# Patient Record
Sex: Female | Born: 1935 | Race: Black or African American | Hispanic: No | Smoking: Never smoker
Health system: Southern US, Community
[De-identification: ages and names within clinical notes are randomized; demographics above are authoritative.]

## PROBLEM LIST (undated history)

## (undated) DIAGNOSIS — M1990 Unspecified osteoarthritis, unspecified site: Secondary | ICD-10-CM

## (undated) DIAGNOSIS — E785 Hyperlipidemia, unspecified: Secondary | ICD-10-CM

## (undated) DIAGNOSIS — T7840XA Allergy, unspecified, initial encounter: Secondary | ICD-10-CM

## (undated) HISTORY — PX: OTHER SURGICAL HISTORY: SHX169

## (undated) HISTORY — PX: APPENDECTOMY: SHX54

## (undated) HISTORY — PX: EXPLORATORY LAPAROTOMY: SUR591

## (undated) HISTORY — PX: TONSILLECTOMY: SUR1361

## (undated) HISTORY — DX: Hyperlipidemia, unspecified: E78.5

## (undated) HISTORY — DX: Unspecified osteoarthritis, unspecified site: M19.90

## (undated) HISTORY — DX: Allergy, unspecified, initial encounter: T78.40XA

---

## 1997-06-29 ENCOUNTER — Other Ambulatory Visit: Admission: RE | Admit: 1997-06-29 | Discharge: 1997-06-29 | Payer: Self-pay | Admitting: Internal Medicine

## 1997-12-21 ENCOUNTER — Ambulatory Visit (HOSPITAL_COMMUNITY): Admission: RE | Admit: 1997-12-21 | Discharge: 1997-12-21 | Payer: Self-pay | Admitting: Internal Medicine

## 1998-12-29 ENCOUNTER — Ambulatory Visit (HOSPITAL_COMMUNITY): Admission: RE | Admit: 1998-12-29 | Discharge: 1998-12-29 | Payer: Self-pay | Admitting: Internal Medicine

## 1998-12-29 ENCOUNTER — Encounter: Payer: Self-pay | Admitting: Internal Medicine

## 1999-02-24 ENCOUNTER — Encounter: Admission: RE | Admit: 1999-02-24 | Discharge: 1999-02-24 | Payer: Self-pay | Admitting: Internal Medicine

## 1999-02-24 ENCOUNTER — Encounter: Payer: Self-pay | Admitting: Internal Medicine

## 1999-07-07 ENCOUNTER — Encounter: Admission: RE | Admit: 1999-07-07 | Discharge: 1999-07-07 | Payer: Self-pay | Admitting: Internal Medicine

## 1999-07-07 ENCOUNTER — Encounter: Payer: Self-pay | Admitting: Internal Medicine

## 2000-01-02 ENCOUNTER — Ambulatory Visit (HOSPITAL_COMMUNITY): Admission: RE | Admit: 2000-01-02 | Discharge: 2000-01-02 | Payer: Self-pay | Admitting: Internal Medicine

## 2000-01-02 ENCOUNTER — Encounter: Payer: Self-pay | Admitting: Internal Medicine

## 2000-12-05 ENCOUNTER — Other Ambulatory Visit: Admission: RE | Admit: 2000-12-05 | Discharge: 2000-12-05 | Payer: Self-pay | Admitting: Internal Medicine

## 2001-02-26 ENCOUNTER — Ambulatory Visit (HOSPITAL_COMMUNITY): Admission: RE | Admit: 2001-02-26 | Discharge: 2001-02-26 | Payer: Self-pay | Admitting: Internal Medicine

## 2001-02-26 ENCOUNTER — Encounter: Payer: Self-pay | Admitting: Internal Medicine

## 2002-02-28 ENCOUNTER — Ambulatory Visit (HOSPITAL_COMMUNITY): Admission: RE | Admit: 2002-02-28 | Discharge: 2002-02-28 | Payer: Self-pay | Admitting: Internal Medicine

## 2002-02-28 ENCOUNTER — Encounter: Payer: Self-pay | Admitting: Internal Medicine

## 2003-03-03 ENCOUNTER — Ambulatory Visit (HOSPITAL_COMMUNITY): Admission: RE | Admit: 2003-03-03 | Discharge: 2003-03-03 | Payer: Self-pay | Admitting: Internal Medicine

## 2003-08-10 ENCOUNTER — Ambulatory Visit (HOSPITAL_COMMUNITY): Admission: RE | Admit: 2003-08-10 | Discharge: 2003-08-10 | Payer: Self-pay | Admitting: Specialist

## 2003-12-25 ENCOUNTER — Other Ambulatory Visit: Admission: RE | Admit: 2003-12-25 | Discharge: 2003-12-25 | Payer: Self-pay | Admitting: Internal Medicine

## 2004-02-09 ENCOUNTER — Ambulatory Visit: Payer: Self-pay | Admitting: Internal Medicine

## 2004-02-24 ENCOUNTER — Ambulatory Visit: Payer: Self-pay | Admitting: Internal Medicine

## 2004-05-23 ENCOUNTER — Ambulatory Visit: Payer: Self-pay | Admitting: Internal Medicine

## 2004-08-02 ENCOUNTER — Encounter: Admission: RE | Admit: 2004-08-02 | Discharge: 2004-08-02 | Payer: Self-pay | Admitting: Internal Medicine

## 2004-08-24 ENCOUNTER — Ambulatory Visit: Payer: Self-pay | Admitting: Family Medicine

## 2004-09-29 ENCOUNTER — Ambulatory Visit: Payer: Self-pay | Admitting: Internal Medicine

## 2004-10-06 ENCOUNTER — Ambulatory Visit: Payer: Self-pay | Admitting: Internal Medicine

## 2005-02-06 ENCOUNTER — Ambulatory Visit: Payer: Self-pay | Admitting: Internal Medicine

## 2005-06-05 ENCOUNTER — Ambulatory Visit: Payer: Self-pay | Admitting: Internal Medicine

## 2005-12-18 ENCOUNTER — Ambulatory Visit: Payer: Self-pay | Admitting: Internal Medicine

## 2005-12-25 ENCOUNTER — Ambulatory Visit: Payer: Self-pay | Admitting: Internal Medicine

## 2006-01-11 ENCOUNTER — Ambulatory Visit: Payer: Self-pay | Admitting: Gastroenterology

## 2006-02-21 ENCOUNTER — Encounter (INDEPENDENT_AMBULATORY_CARE_PROVIDER_SITE_OTHER): Payer: Self-pay | Admitting: *Deleted

## 2006-02-21 ENCOUNTER — Ambulatory Visit: Payer: Self-pay | Admitting: Gastroenterology

## 2006-03-02 ENCOUNTER — Ambulatory Visit: Payer: Self-pay | Admitting: Internal Medicine

## 2006-07-04 ENCOUNTER — Ambulatory Visit: Payer: Self-pay | Admitting: Internal Medicine

## 2006-07-04 LAB — CONVERTED CEMR LAB
Albumin: 3.6 g/dL (ref 3.5–5.2)
Alkaline Phosphatase: 61 units/L (ref 39–117)
LDL Cholesterol: 136 mg/dL — ABNORMAL HIGH (ref 0–99)
Total CHOL/HDL Ratio: 5.1

## 2006-08-27 ENCOUNTER — Ambulatory Visit: Payer: Self-pay | Admitting: Internal Medicine

## 2006-08-27 ENCOUNTER — Encounter: Payer: Self-pay | Admitting: Internal Medicine

## 2006-11-12 DIAGNOSIS — J309 Allergic rhinitis, unspecified: Secondary | ICD-10-CM | POA: Insufficient documentation

## 2006-11-12 DIAGNOSIS — E785 Hyperlipidemia, unspecified: Secondary | ICD-10-CM

## 2007-01-07 ENCOUNTER — Ambulatory Visit: Payer: Self-pay | Admitting: Internal Medicine

## 2007-01-07 DIAGNOSIS — T887XXA Unspecified adverse effect of drug or medicament, initial encounter: Secondary | ICD-10-CM

## 2007-01-07 DIAGNOSIS — M1990 Unspecified osteoarthritis, unspecified site: Secondary | ICD-10-CM

## 2007-01-07 LAB — CONVERTED CEMR LAB
AST: 28 units/L (ref 0–37)
Alkaline Phosphatase: 61 units/L (ref 39–117)
Cholesterol, target level: 200 mg/dL
HDL goal, serum: 40 mg/dL
HDL: 39.2 mg/dL (ref >39.0)
LDL Goal: 130 mg/dL
Total Bilirubin: 0.7 mg/dL (ref 0.3–1.2)
Total Protein: 6.7 g/dL (ref 6.0–8.3)
Triglycerides: 127 mg/dL (ref 0–149)

## 2007-05-22 ENCOUNTER — Ambulatory Visit: Payer: Self-pay | Admitting: Internal Medicine

## 2007-05-22 LAB — CONVERTED CEMR LAB
BUN: 14 mg/dL (ref 6–23)
Basophils Absolute: 0 10*3/uL (ref 0.0–0.1)
Calcium: 9.1 mg/dL (ref 8.4–10.5)
Chloride: 106 meq/L (ref 96–112)
Eosinophils Absolute: 0.1 10*3/uL (ref 0.0–0.6)
Eosinophils Relative: 1.2 % (ref 0.0–5.0)
GFR calc Af Amer: 68 mL/min
GFR calc non Af Amer: 56 mL/min
Glucose, Bld: 94 mg/dL (ref 70–99)
MCV: 95 fL (ref 78.0–100.0)
Monocytes Relative: 9.2 % (ref 3.0–11.0)
Neutro Abs: 4.2 10*3/uL (ref 1.4–7.7)
Platelets: 189 10*3/uL (ref 150–400)
RBC: 4.27 M/uL (ref 3.87–5.11)
WBC: 7.2 10*3/uL (ref 4.5–10.5)

## 2007-09-23 ENCOUNTER — Ambulatory Visit: Payer: Self-pay | Admitting: Internal Medicine

## 2007-09-23 DIAGNOSIS — M1710 Unilateral primary osteoarthritis, unspecified knee: Secondary | ICD-10-CM | POA: Insufficient documentation

## 2007-09-23 LAB — CONVERTED CEMR LAB
ALT: 21 units/L (ref 0–35)
AST: 23 units/L (ref 0–37)
Cholesterol: 190 mg/dL (ref 0–200)
HDL: 38.1 mg/dL — ABNORMAL LOW (ref >39.0)
Triglycerides: 96 mg/dL (ref 0–149)
VLDL: 19 mg/dL (ref 0–40)

## 2007-09-25 ENCOUNTER — Telehealth (INDEPENDENT_AMBULATORY_CARE_PROVIDER_SITE_OTHER): Payer: Self-pay | Admitting: *Deleted

## 2007-09-26 ENCOUNTER — Ambulatory Visit: Payer: Self-pay

## 2007-09-26 ENCOUNTER — Encounter: Payer: Self-pay | Admitting: Internal Medicine

## 2008-01-08 ENCOUNTER — Ambulatory Visit: Payer: Self-pay | Admitting: Internal Medicine

## 2008-01-08 LAB — CONVERTED CEMR LAB
ALT: 20 units/L (ref 0–35)
HDL: 39.6 mg/dL (ref >39.0)
Total Bilirubin: 0.6 mg/dL (ref 0.3–1.2)
Total CHOL/HDL Ratio: 5
VLDL: 21 mg/dL (ref 0–40)

## 2008-05-25 ENCOUNTER — Ambulatory Visit: Payer: Self-pay | Admitting: Internal Medicine

## 2008-05-25 DIAGNOSIS — M810 Age-related osteoporosis without current pathological fracture: Secondary | ICD-10-CM | POA: Insufficient documentation

## 2008-06-03 LAB — CONVERTED CEMR LAB: Vit D, 25-Hydroxy: 22 ng/mL — ABNORMAL LOW (ref 30–89)

## 2008-07-18 DEATH — deceased

## 2008-08-28 ENCOUNTER — Encounter: Payer: Self-pay | Admitting: Internal Medicine

## 2008-08-28 ENCOUNTER — Ambulatory Visit: Payer: Self-pay | Admitting: Family Medicine

## 2008-09-25 ENCOUNTER — Ambulatory Visit: Payer: Self-pay | Admitting: Internal Medicine

## 2008-09-25 LAB — CONVERTED CEMR LAB
Albumin: 3.8 g/dL (ref 3.5–5.2)
Alkaline Phosphatase: 53 units/L (ref 39–117)
Cholesterol: 205 mg/dL — ABNORMAL HIGH (ref 0–200)
HDL: 41.2 mg/dL (ref >39.00)
Total Protein: 7 g/dL (ref 6.0–8.3)
VLDL: 26 mg/dL (ref 0.0–40.0)

## 2009-02-03 ENCOUNTER — Ambulatory Visit: Payer: Self-pay | Admitting: Internal Medicine

## 2009-03-29 ENCOUNTER — Ambulatory Visit: Payer: Self-pay | Admitting: Internal Medicine

## 2009-03-29 LAB — CONVERTED CEMR LAB
ALT: 22 units/L (ref 0–35)
AST: 23 units/L (ref 0–37)
Albumin: 3.6 g/dL (ref 3.5–5.2)
Alkaline Phosphatase: 52 units/L (ref 39–117)
Basophils Relative: 0.8 % (ref 0.0–3.0)
Cholesterol, target level: 200 mg/dL
Cholesterol: 193 mg/dL (ref 0–200)
Direct LDL: 128 mg/dL
Eosinophils Relative: 1.5 % (ref 0.0–5.0)
Hemoglobin: 13.1 g/dL (ref 12.0–15.0)
Lymphocytes Relative: 27.8 % (ref 12.0–46.0)
MCHC: 32.9 g/dL (ref 30.0–36.0)
Monocytes Relative: 8.9 % (ref 3.0–12.0)
Neutro Abs: 3.4 10*3/uL (ref 1.4–7.7)
RBC: 4.11 M/uL (ref 3.87–5.11)
Total Protein: 6.6 g/dL (ref 6.0–8.3)
Vit D, 25-Hydroxy: 47 ng/mL (ref 30–89)

## 2009-04-28 ENCOUNTER — Telehealth: Payer: Self-pay | Admitting: Internal Medicine

## 2009-07-26 ENCOUNTER — Ambulatory Visit: Payer: Self-pay | Admitting: Internal Medicine

## 2009-07-26 LAB — CONVERTED CEMR LAB
Cholesterol: 204 mg/dL — ABNORMAL HIGH (ref 0–200)
Total CHOL/HDL Ratio: 4
Vit D, 25-Hydroxy: 47 ng/mL (ref 30–89)

## 2009-12-03 ENCOUNTER — Ambulatory Visit: Payer: Self-pay | Admitting: Internal Medicine

## 2009-12-03 ENCOUNTER — Telehealth: Payer: Self-pay | Admitting: Internal Medicine

## 2009-12-03 DIAGNOSIS — R05 Cough: Secondary | ICD-10-CM

## 2009-12-03 DIAGNOSIS — R059 Cough, unspecified: Secondary | ICD-10-CM | POA: Insufficient documentation

## 2009-12-06 ENCOUNTER — Ambulatory Visit: Payer: Self-pay | Admitting: Internal Medicine

## 2009-12-06 DIAGNOSIS — J189 Pneumonia, unspecified organism: Secondary | ICD-10-CM | POA: Insufficient documentation

## 2010-01-24 ENCOUNTER — Ambulatory Visit: Payer: Self-pay | Admitting: Internal Medicine

## 2010-01-24 LAB — CONVERTED CEMR LAB: Vit D, 25-Hydroxy: 69 ng/mL (ref 30–89)

## 2010-04-21 NOTE — Assessment & Plan Note (Signed)
Summary: 6 month rov/njr   Vital Signs:  Patient profile:   77 year old female Height:      64 inches Weight:      164 pounds BMI:     28.25 Temp:     98.2 degrees F oral Pulse rate:   72 / minute Resp:     14 per minute BP sitting:   122 / 80  (left arm)  Vitals Entered By: Willy Eddy, LPN (January 24, 2010 8:28 AM) CC: roa- uri much improved, Lipid Management Is Patient Diabetic? No   Primary Care Provider:  Stacie Glaze MD  CC:  roa- uri much improved and Lipid Management.  History of Present Illness: the last visit was for pneumonia It  has completely resolved she has been on ergocalciferaol for vit d deficiency and needs testing she ahs OA and has been on mobic with stabe results not walking!!!!!! discussion of 1000 steps needed to prevent falls   Lipid Management History:      Positive NCEP/ATP III risk factors include female age 38 years old or older.  Negative NCEP/ATP III risk factors include no history of early menopause without estrogen hormone replacement, non-diabetic, no family history for ischemic heart disease, non-tobacco-user status, non-hypertensive, no ASHD (atherosclerotic heart disease), no prior stroke/TIA, no peripheral vascular disease, and no history of aortic aneurysm.     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) 4)  Osteoporosis  (ICD-733.00) 5)  Loc Osteoarthros Not Spec Prim/sec Lower Leg  (ICD-715.36) 6)  Advef, Drug/medicinal/biological Subst Nos  (ICD-995.20) 7)  Osteoarthritis  (ICD-715.90) 8)  Family History Diabetes 1st Degree Relative  (ICD-V18.0) 9)  Hyperlipidemia  (ICD-272.4) 10)  Allergic Rhinitis  (ICD-477.9)  Current Problems (verified): 1)  Pneumonia, Right Upper Lobe   (ICD-486) 2)  Cough  (ICD-786.2) 3)  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 One By Mouth Daily 2)  Mobic 15 Mg  Tabs (Meloxicam) .... Take 1 Tablet By Mouth Once A Day 3)  Fish Oil 1000 Mg  Caps (Omega-3 Fatty Acids) .Marland Kitchen.. 1 Once Daily 4)  Ergocalciferol 50000 Unit Caps (Ergocalciferol) .Marland Kitchen.. 1 Every Week 5)  Calcium Carbonate-Vitamin D 600-400 Mg-Unit Tabs (Calcium Carbonate-Vitamin D) .... One By Mouth  Bid 6)  Avelox 400 Mg Tabs (Moxifloxacin Hcl) .... One By Mouth Dialy For 5 Day 7)  Hydromet 5-1.5 Mg/56ml Syrp (Hydrocodone-Homatropine) .... One Teaspoon Every 8 Hours As Needed Cough  Current Medications (verified): 1)  Zocor 40 Mg  Tabs (Simvastatin) .... One By Mouth Daily 2)  Mobic 15 Mg  Tabs (Meloxicam) .... Take 1 Tablet By Mouth Once A Day 3)  Fish Oil 1000 Mg  Caps (Omega-3 Fatty Acids) .Marland Kitchen.. 1 Once Daily 4)  Ergocalciferol 50000 Unit Caps (Ergocalciferol) .Marland Kitchen.. 1 Every Week 5)  Calcium Carbonate-Vitamin D 600-400 Mg-Unit Tabs (Calcium Carbonate-Vitamin D) .... One By Mouth  Bid  Allergies (verified): No Known Drug Allergies  Past History:  Family History: Last updated: 11/12/2006 Fam hx MI Family History Diabetes 1st degree relative Family History Hypertension Family History of Cardiovascular disorder  Social History: Last updated: 01/07/2007 Retired Married Never Smoked Alcohol use-no Drug use-no  Risk Factors: Smoking Status: never (01/24/2010)  Past medical, surgical, family and social histories (including risk factors) reviewed, and no changes noted (except as noted  below).  Past Medical History: Reviewed history from 01/07/2007 and no changes required. Allergic rhinitis Hyperlipidemia Osteoarthritis  Past Surgical History: Reviewed history from 11/12/2006 and no changes required. Appendectomy Laparotomy-exploratory Tonsillectomy Foot sx-bilat  Family History: Reviewed history from 11/12/2006 and no changes required. Fam hx MI Family History Diabetes 1st degree relative Family History Hypertension Family History of Cardiovascular disorder  Social History: Reviewed history from 01/07/2007 and no changes required. Retired Married Never Smoked Alcohol use-no Drug use-no  Review of Systems  The patient denies anorexia, fever, weight loss, weight gain, vision loss, decreased hearing, hoarseness, chest pain, syncope, dyspnea on exertion, peripheral edema, prolonged cough, headaches, hemoptysis, abdominal pain, melena, hematochezia, severe indigestion/heartburn, hematuria, incontinence, genital sores, muscle weakness, suspicious skin lesions, transient blindness, difficulty walking, depression, unusual weight change, abnormal bleeding, enlarged lymph nodes, angioedema, and breast masses.         Flu Vaccine Consent Questions     Do you have a history of severe allergic reactions to this vaccine? no    Any prior history of allergic reactions to egg and/or gelatin? no    Do you have a sensitivity to the preservative Thimersol? no    Do you have a past history of Guillan-Barre Syndrome? no    Do you currently have an acute febrile illness? no    Have you ever had a severe reaction to latex? no    Vaccine information given and explained to patient? yes    Are you currently pregnant? no    Lot Number:[id number]   Exp Date:09/17/2010   Site Given  Left Deltoid IM   Physical Exam  General:  average weight, good nutrition, and meticulous grooming.   Head:  Normocephalic and atraumatic without obvious abnormalities. No apparent alopecia or  balding. Ears:  R ear normal and L ear normal.   Nose:  no external deformity and no nasal discharge.   Mouth:  Oral mucosa and oropharynx without lesions or exudates.  Teeth in good repair. Neck:  supple.   Lungs:  normal respiratory effort, no crackles, and no wheezes.   Heart:  normal rate and regular rhythm.   Abdomen:  soft, non-tender, and normal bowel sounds.   Msk:  decreased ROM, joint tenderness, and joint swelling.   Pulses:  R and L carotid,radial,femoral,dorsalis pedis and posterior tibial pulses are full and equal bilaterally Extremities:  trace left pedal edema and trace right pedal edema.   Neurologic:  alert & oriented X3 and finger-to-nose normal.     Impression & Recommendations:  Problem # 1:  PNEUMONIA, RIGHT UPPER LOBE (ICD-486)  resolved The following medications were removed from the medication list:    Avelox 400 Mg Tabs (Moxifloxacin hcl) ..... One by mouth dialy for 5 day  nstructed patient to complete antibiotics, and call for worsened shortness of breath or new symptoms.   Problem # 2:  OSTEOPOROSIS (ICD-733.00) blood work to [facility] the appropriate dose vitamin d 50,000 a week Discussed medication use, applications of heat or ice, and exercises.   Orders: T-Vitamin D (25-Hydroxy) [phone]) Venipuncture (  13244)  Problem # 3:  OSTEOARTHRITIS (ICD-715.90) stable Her updated medication list for this problem includes:    Mobic 15 Mg Tabs (Meloxicam) .Marland Kitchen... Take 1 tablet by mouth once a day  Complete Medication List: 1)  Zocor 40 Mg Tabs (Simvastatin) .... One by mouth daily 2)  Mobic 15 Mg Tabs (Meloxicam) .... Take 1 tablet by mouth once a day 3)  Fish Oil 1000 Mg Caps (Omega-3 fatty acids) .Marland Kitchen.. 1 once daily 4)  Ergocalciferol 50000 Unit Caps (Ergocalciferol) .Marland Kitchen.. 1 every week 5)  Calcium Carbonate-vitamin D 600-400 Mg-unit Tabs (Calcium carbonate-vitamin d) .... One by mouth  bid  Other Orders: Flu Vaccine 20yrs + MEDICARE PATIENTS  (W1027) Administration Flu vaccine - MCR (G0008)  Lipid Assessment/Plan:      Based on NCEP/ATP III, the patient's risk factor category is "0-1 risk factors".  The patient's lipid goals are as follows: Total cholesterol goal is 200; LDL cholesterol goal is 160; HDL cholesterol goal is 40; Triglyceride goal is 150.  Her LDL cholesterol goal has been met.    Patient Instructions: 1)  Please schedule a follow-up appointment in 4 months.   Orders Added: 1)  Flu Vaccine 41yrs + MEDICARE PATIENTS [Q2039] 2)  Administration Flu vaccine - MCR [G0008] 3)  T-Vitamin D (25-Hydroxy) [25366-44034] 4)  Venipuncture [74259] 5)  Est. Patient Level IV [56387]  Appended Document: Orders Update    Clinical Lists Changes  Orders: Added new Service order of Specimen Handling (56433) - Signed

## 2010-04-21 NOTE — Assessment & Plan Note (Signed)
Summary: rov/mm   Vital Signs:  Patient profile:   77 year old female Height:      64 inches Weight:      168 pounds Temp:     98.2 degrees F oral Pulse rate:   68 / minute Resp:     14 per minute BP sitting:   116 / 64  (left arm)  Vitals Entered By: Willy Eddy, LPN (March 29, 2009 8:13 AM) CC: roa, Lipid Management   CC:  roa and Lipid Management.  History of Present Illness: follow up moderated arthritic pain tolerating lipid meds allergies are stable  Lipid Management History:      Positive NCEP/ATP III risk factors include female age 14 years old or older.  Negative NCEP/ATP III risk factors include no history of early menopause without estrogen hormone replacement, non-diabetic, no family history for ischemic heart disease, non-tobacco-user status, non-hypertensive, no ASHD (atherosclerotic heart disease), no prior stroke/TIA, no peripheral vascular disease, and no history of aortic aneurysm.     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 One By Mouth Daily 2)  Mobic 15 Mg  Tabs (Meloxicam) .... Take 1 Tablet By Mouth Once A Day 3)  Fish Oil 1000 Mg  Caps (Omega-3 Fatty Acids) .Marland Kitchen.. 1 Once Daily 4)  Ergocalciferol 50000 Unit Caps (Ergocalciferol) .Marland Kitchen.. 1 Every Week 5)  Calcium Carbonate-Vitamin D 600-400 Mg-Unit Tabs (Calcium Carbonate-Vitamin D) .... One By Mouth  Bid  Current Medications (verified): 1)  Zocor 40 Mg  Tabs (Simvastatin) .... One By Mouth  Daily 2)  Mobic 15 Mg  Tabs (Meloxicam) .... Take 1 Tablet By Mouth Once A Day 3)  Fish Oil 1000 Mg  Caps (Omega-3 Fatty Acids) .Marland Kitchen.. 1 Once Daily 4)  Ergocalciferol 50000 Unit Caps (Ergocalciferol) .Marland Kitchen.. 1 Every Week 5)  Calcium Carbonate-Vitamin D 600-400 Mg-Unit Tabs (Calcium Carbonate-Vitamin D) .... One By Mouth  Bid  Allergies (verified): No Known Drug Allergies  Past History:  Family History: Last updated: 11/12/2006 Fam hx MI Family History Diabetes 1st degree relative Family History Hypertension Family History of Cardiovascular disorder  Social History: Last updated: 01/07/2007 Retired Married Never Smoked Alcohol use-no Drug use-no  Risk Factors: Smoking Status: never (03/29/2009)  Past medical, surgical, family and social histories (including risk factors) reviewed, and no changes noted (except as noted below).  Past Medical History: Reviewed history from 01/07/2007 and no changes required. Allergic rhinitis Hyperlipidemia Osteoarthritis  Past Surgical History: Reviewed history from 11/12/2006 and no changes required. Appendectomy Laparotomy-exploratory Tonsillectomy Foot sx-bilat  Family History: Reviewed history from 11/12/2006 and no changes required. Fam hx MI Family History Diabetes 1st degree relative Family History Hypertension Family History of Cardiovascular disorder  Social History: Reviewed history from 01/07/2007 and no changes required. Retired Married Never Smoked Alcohol use-no Drug use-no  Review of Systems  The patient denies anorexia, fever, weight loss, weight gain, vision loss, decreased hearing, hoarseness, chest pain, syncope, dyspnea on exertion, peripheral edema, prolonged cough, headaches, hemoptysis, abdominal pain, melena, hematochezia, severe indigestion/heartburn,  hematuria, incontinence, genital sores, muscle weakness, suspicious skin lesions, transient blindness, difficulty walking, depression, unusual weight change,  abnormal bleeding, enlarged lymph nodes, angioedema, and breast masses.    Physical Exam  General:  average weight, good nutrition, and meticulous grooming.   Head:  Normocephalic and atraumatic without obvious abnormalities. No apparent alopecia or balding. Ears:  R ear normal and L ear normal.   Nose:  no external deformity and no nasal discharge.   Neck:  supple.   Lungs:  normal respiratory effort and normal breath sounds.   Heart:  normal rate and regular rhythm.   Abdomen:  soft, non-tender, and normal bowel sounds.   Msk:  decreased ROM, joint tenderness, and joint swelling.   Extremities:  trace left pedal edema and trace right pedal edema.   Neurologic:  alert & oriented X3 and finger-to-nose normal.     Impression & Recommendations:  Problem # 1:  HYPERLIPIDEMIA (ICD-272.4) Assessment Improved review of zocor and the potential sie effect . she feels well.  Her updated medication list for this problem includes:    Zocor 40 Mg Tabs (Simvastatin) ..... One by mouth daily  Labs Reviewed: SGOT: 26 (09/25/2008)   SGPT: 16 (09/25/2008)  Lipid Goals: Chol Goal: 200 (01/07/2007)   HDL Goal: 40 (01/07/2007)   LDL Goal: 130 (01/07/2007)   TG Goal: 150 (01/07/2007)  Prior 10 Yr Risk Heart Disease: 11 % (09/25/2008)   HDL:41.20 (09/25/2008), 39.6 (01/08/2008)  LDL:138 (01/08/2008), 133 (09/23/2007)  Chol:205 (09/25/2008), 198 (01/08/2008)  Trig:130.0 (09/25/2008), 103 (01/08/2008)  Orders: Venipuncture (91478) TLB-Cholesterol, HDL (83718-HDL) TLB-Cholesterol, Direct LDL (83721-DIRLDL) TLB-Cholesterol, Total (82465-CHO) TLB-TSH (Thyroid Stimulating Hormone) (84443-TSH)  Problem # 2:  OSTEOPOROSIS (ICD-733.00) Assessment: Unchanged  on vitamin d and calcium Discussed medication use, applications of heat or ice, and exercises.   Orders: T-Vitamin D (25-Hydroxy) [phone])  Problem # 3:  ALLERGIC RHINITIS (ICD-477.9) stable Discussed use of allergy medications and  environmental measures.   Problem # 4:  OSTEOARTHRITIS (ICD-715.90) monitering the arthrotis and discussion of exercizes Orders: TLB-Hepatic/Liver Function Pnl (80076-HEPATIC)  Her updated medication list for this problem includes:    Mobic 15 Mg Tabs (Meloxicam) .Marland Kitchen... Take 1 tablet by mouth once a day  Discussed use of medications, application of heat or cold, and exercises.   Complete Medication List: 1)  Zocor 40 Mg Tabs (Simvastatin) .... One by mouth daily 2)  Mobic 15 Mg Tabs (Meloxicam) .... Take 1 tablet by mouth once a day 3)  Fish Oil 1000 Mg Caps (Omega-3 fatty acids) .Marland Kitchen.. 1 once daily 4)  Ergocalciferol 50000 Unit Caps (Ergocalciferol) .Marland Kitchen.. 1 every week 5)  Calcium Carbonate-vitamin D 600-400 Mg-unit Tabs (Calcium carbonate-vitamin d) .... One by mouth  bid  Other Orders: TLB-CBC Platelet - w/Differential (85025-CBCD)  Lipid Assessment/Plan:      Based on NCEP/ATP III, the patient's risk factor category is "0-1 risk factors".  The patient's lipid goals are as follows: Total cholesterol goal is 200; LDL cholesterol goal is 160; HDL cholesterol goal is 40; Triglyceride goal is 150.  Her LDL cholesterol goal has been met.    Patient Instructions: 1)  Please schedule a follow-up appointment in 4 months.

## 2010-04-21 NOTE — Progress Notes (Signed)
Summary: chest cold with rattle chest sore  Phone Note Call from Patient Call back at [facility] Phone [phone]   Summary of Call: Chest cold.  Coughing with rattle.  Chest sore.   Sore throat.  Started Monday.  Wants to see Dr. Shela Commons if at all possible.   Initial call taken by: Rudy Jew, RN,  December 03, 2009 8:31 AM  Follow-up for Phone Call        per dr Lovell Sheehan- may have z pack and hycodan 1 tsp every 8 hrs as needed cough and give 6 oz and send for cxr today Follow-up by: Willy Eddy, LPN,  December 03, 2009 8:52 AM  Additional Follow-up for Phone Call Additional follow up Details #1::        [facility].  Phone call complete. Additional Follow-up by: Rudy Jew, RN,  December 03, 2009 9:04 AM  New Problems: COUGH (ICD-786.2)   New Problems: COUGH (ICD-786.2) New/Updated Medications: ZITHROMAX Z-PAK 250 MG TABS (AZITHROMYCIN) As directed1 HYDROMET 5-1.5 MG/5ML SYRP (HYDROCODONE-HOMATROPINE) One teaspoon every 8 hours as needed cough Prescriptions: HYDROMET 5-1.5 MG/5ML SYRP (HYDROCODONE-HOMATROPINE) One teaspoon every 8 hours as needed cough  #6 ounces x 0   Entered by:   Rudy Jew, RN   Authorized by:   Stacie Glaze MD   Signed by:   Rudy Jew, RN on 12/03/2009   Method used:   Telephoned to ...       [facility]  [address] [phone]* (retail)       [address]       [place], [place]  [address]       Ph: [phone]       Fax: [phone]   RxID:   [id number] ZITHROMAX Z-PAK 250 MG TABS (AZITHROMYCIN) As directed1  #1 x 0   Entered by:   Rudy Jew, RN   Authorized by:   Stacie Glaze MD   Signed by:   Rudy Jew, RN on 12/03/2009   Method used:   Electronically to        [facility] [phone]* (retail)       [address]       [place], [place]  [address]       Ph: [phone]       Fax: [phone]   RxID:   [id number]

## 2010-04-21 NOTE — Progress Notes (Signed)
Summary: quesy/gripey stomach  Phone Note Call from Patient Call back at [facility] Phone [phone]   Summary of Call: Bad cold last week.  Better.  Then diarrhea started.  Feeling queasy in stomach & feels like she has to go just anytime.  No fever.  Still has just a little diarrhea, haven't had today, 1-2 x yesterday.  Ate breakfast today & Inetta Fermo, then appetitie decreases.  Green beas not seasoned & jello lunch.   What to take for the queasy/gripey stomach.  Hasn't taken anything. [facility] [address] [place] [phone].  NKDA> Initial call taken by: Rudy Jew, RN,  April 28, 2009 11:51 AM  Follow-up for Phone Call        can use i modioum for diarhhea and phenergan suppository or pills(her choice) 25 mg #6 1 every 6 hours as needed nausea-clear liquid for 48 hours--per dr Lovell Sheehan Follow-up by: Willy Eddy, LPN,  April 28, 2009 1:21 PM  Additional Follow-up for Phone Call Additional follow up Details #1::        Pt notified of Dr. Lovell Sheehan' recommendations. Additional Follow-up by: Lynann Beaver CMA,  April 28, 2009 1:31 PM    New/Updated Medications: PROMETHAZINE HCL 25 MG TABS (PROMETHAZINE HCL) one by mouth q 6 hrs as needed nausea and vomiting. Prescriptions: PROMETHAZINE HCL 25 MG TABS (PROMETHAZINE HCL) one by mouth q 6 hrs as needed nausea and vomiting.  #20 x 0   Entered by:   Lynann Beaver CMA   Authorized by:   Stacie Glaze MD   Signed by:   Lynann Beaver CMA on 04/28/2009   Method used:   Electronically to        [facility] [phone]* (retail)       [address]       [place], [place]  [address]       Ph: [phone]       Fax: [phone]   RxID:   [id number]

## 2010-04-21 NOTE — Assessment & Plan Note (Signed)
Summary: 4 MONTH ROV/NJR   Vital Signs:  Patient profile:   77 year old female Height:      64 inches Weight:      164 pounds BMI:     28.25 Temp:     98.2 degrees F oral Pulse rate:   68 / minute Resp:     14 per minute BP sitting:   130 / 80  (left arm)  Vitals Entered By: Willy Eddy, LPN (Jul 26, 1608 8:12 AM) CC: roa, Lipid Management   CC:  roa and Lipid Management.  History of Present Illness: The pt had a stomah virus in Feb but it passed with symptomatic care monitering of lipids and review of the labs form 03/2009 as well as the vit d levels meds reviewed   Hyperlipidemia Follow-Up      This is an 77 year old woman who presents for Hyperlipidemia follow-up.  The patient denies muscle aches, GI upset, abdominal pain, flushing, itching, constipation, diarrhea, and fatigue.  The patient denies the following symptoms: chest pain/pressure, exercise intolerance, dypsnea, palpitations, syncope, and pedal edema.  Compliance with medications (by patient report) has been near 100%.  Dietary compliance has been good.  The patient reports exercising 3-4X per week.    Lipid Management History:      Positive NCEP/ATP III risk factors include female age 68 years old or older.  Negative NCEP/ATP III risk factors include no history of early menopause without estrogen hormone replacement, non-diabetic, no family history for ischemic heart disease, non-tobacco-user status, non-hypertensive, no ASHD (atherosclerotic heart disease), no prior stroke/TIA, no peripheral vascular disease, and no history of aortic aneurysm.     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 One By Mouth Daily 2)  Mobic 15 Mg  Tabs (Meloxicam) .... Take 1 Tablet By Mouth Once A Day 3)  Fish Oil 1000 Mg  Caps (Omega-3 Fatty Acids) .Marland Kitchen.. 1 Once Daily 4)  Ergocalciferol 50000 Unit Caps (Ergocalciferol) .Marland Kitchen.. 1 Every Week 5)  Calcium Carbonate-Vitamin D 600-400 Mg-Unit Tabs (Calcium Carbonate-Vitamin D) .... One By Mouth  Bid 6)  Promethazine Hcl 25 Mg Tabs (Promethazine Hcl) .... One By Mouth Q 6 Hrs As Needed Nausea and Vomiting.  Current Medications (verified): 1)  Zocor 40 Mg  Tabs (Simvastatin) .... One By Mouth Daily 2)  Mobic 15 Mg  Tabs (Meloxicam) .... Take 1 Tablet By Mouth Once A Day 3)  Fish Oil 1000 Mg  Caps (Omega-3 Fatty Acids) .Marland Kitchen.. 1 Once Daily 4)  Ergocalciferol 50000 Unit Caps (Ergocalciferol) .Marland Kitchen.. 1 Every Week 5)  Calcium Carbonate-Vitamin D 600-400 Mg-Unit Tabs (Calcium Carbonate-Vitamin D) .... One  By Mouth  Bid  Allergies (verified): No Known Drug Allergies  Past History:  Family History: Last updated: 11/12/2006 Fam hx MI Family History Diabetes 1st degree relative Family History Hypertension Family History of Cardiovascular disorder  Social History: Last updated: 01/07/2007 Retired Married Never Smoked Alcohol use-no Drug use-no  Risk Factors: Smoking Status: never (07/26/2009)  Past medical, surgical, family and social histories (including risk factors) reviewed,  and no changes noted (except as noted below). Past medical, surgical, family and social histories (including risk factors) reviewed for relevance to current acute and chronic problems.  Past Medical History: Reviewed history from 01/07/2007 and no changes required. Allergic rhinitis Hyperlipidemia Osteoarthritis  Past Surgical History: Reviewed history from 11/12/2006 and no changes required. Appendectomy Laparotomy-exploratory Tonsillectomy Foot sx-bilat  Family History: Reviewed history from 11/12/2006 and no changes required. Fam hx MI Family History Diabetes 1st degree relative Family History Hypertension Family History of Cardiovascular disorder  Social History: Reviewed history from 01/07/2007 and no changes required. Retired Married Never Smoked Alcohol use-no Drug use-no  Review of Systems  The patient denies anorexia, fever, weight loss, weight gain, vision loss, decreased hearing, hoarseness, chest pain, syncope, dyspnea on exertion, peripheral edema, prolonged cough, headaches, hemoptysis, abdominal pain, melena, hematochezia, severe indigestion/heartburn, hematuria, incontinence, genital sores, muscle weakness, suspicious skin lesions, transient blindness, difficulty walking, depression, unusual weight change, abnormal bleeding, enlarged lymph nodes, angioedema, and breast masses.    Physical Exam  General:  average weight, good nutrition, and meticulous grooming.   Head:  Normocephalic and atraumatic without obvious abnormalities. No apparent alopecia or balding. Ears:  R ear normal and L ear normal.   Neck:  supple.   Lungs:  normal respiratory effort and normal breath sounds.   Heart:  normal rate and regular rhythm.   Abdomen:  soft, non-tender, and normal bowel sounds.   Extremities:  trace left pedal edema and trace right pedal edema.   Neurologic:  alert & oriented X3 and finger-to-nose normal.     Impression & Recommendations:  Problem # 1:   OSTEOPOROSIS (ICD-733.00) Assessment Unchanged  monitering vitamin d levels and encouraging calcium  Discussed medication use, applications of heat or ice, and exercises.   Orders: Venipuncture (04540) T-Vitamin D (25-Hydroxy) (98119-14782)  Problem # 2:  HYPERLIPIDEMIA (ICD-272.4) Assessment: Unchanged  stable on medicaitons with monitering of liver and lipids today Her updated medication list for this problem includes:    Zocor 40 Mg Tabs (Simvastatin) ..... One by mouth daily  Labs Reviewed: SGOT: 23 (03/29/2009)   SGPT: 22 (03/29/2009)  Lipid Goals: Chol Goal: 200 (03/29/2009)   HDL Goal: 40 (03/29/2009)   LDL Goal: 160 (03/29/2009)   TG Goal: 150 (03/29/2009)  10 Yr Risk Heart Disease: 9 % Prior 10 Yr Risk Heart Disease: 7 % (03/29/2009)   HDL:45.10 (03/29/2009), 41.20 (09/25/2008)  LDL:138 (01/08/2008), 133 (09/23/2007)  Chol:193 (03/29/2009), 205 (09/25/2008)  Trig:130.0 (09/25/2008), 103 (01/08/2008)  Orders: TLB-Cholesterol, HDL (83718-HDL) TLB-Cholesterol, Direct LDL (83721-DIRLDL) TLB-Cholesterol, Total (82465-CHO)  Problem # 3:  ADVEF, DRUG/MEDICINAL/BIOLOGICAL SUBST NOS (ICD-995.20)  monitering liver functions  Orders: TLB-Lipid Panel (80061-LIPID)  Complete Medication List: 1)  Zocor 40 Mg Tabs (Simvastatin) .... One by mouth daily 2)  Mobic 15 Mg Tabs (Meloxicam) .... Take 1 tablet by mouth once a day 3)  Fish Oil 1000 Mg Caps (Omega-3 fatty acids) .Marland Kitchen.. 1 once daily 4)  Ergocalciferol 50000 Unit Caps (Ergocalciferol) .Marland Kitchen.. 1 every week 5)  Calcium Carbonate-vitamin D 600-400 Mg-unit Tabs (Calcium carbonate-vitamin d) .... One  by mouth  bid  Lipid Assessment/Plan:      Based on NCEP/ATP III, the patient's risk factor category is "0-1 risk factors".  The patient's lipid goals are as follows: Total cholesterol goal is 200; LDL cholesterol goal is 160; HDL cholesterol goal is 40; Triglyceride goal is 150.  Her LDL cholesterol goal has been met.    Patient  Instructions: 1)  Please schedule a follow-up appointment in 6 months.

## 2010-04-21 NOTE — Assessment & Plan Note (Signed)
Summary: uri/bmw   Vital Signs:  Patient profile:   77 year old female Height:      64 inches Weight:      164 pounds BMI:     28.25 Temp:     98.7 degrees F oral Pulse rate:   76 / minute Resp:     14 per minute BP sitting:   140 / 80  (left arm)  Vitals Entered By: Willy Eddy, LPN (December 06, 2009 1:57 PM) CC: c/o uri Is Patient Diabetic? No   Primary Care Provider:  Stacie Glaze MD  CC:  c/o uri.  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) 3)  Osteoporosis  (ICD-733.00) 4)  Loc Osteoarthros Not Spec Prim/sec Lower Leg  (ICD-715.36) 5)  Advef, Drug/medicinal/biological Subst Nos  (ICD-995.20) 6)  Osteoarthritis  (ICD-715.90) 7)  Family History Diabetes 1st Degree Relative  (ICD-V18.0) 8)  Hyperlipidemia  (ICD-272.4) 9)  Allergic Rhinitis  (ICD-477.9)  Current Problems (verified): 1)  Cough  (ICD-786.2) 2)  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 One By Mouth Daily 2)  Mobic 15 Mg  Tabs (Meloxicam) .... Take 1 Tablet By Mouth Once A Day 3)  Fish  Oil 1000 Mg  Caps (Omega-3 Fatty Acids) .Marland Kitchen.. 1 Once Daily 4)  Ergocalciferol 50000 Unit Caps (Ergocalciferol) .Marland Kitchen.. 1 Every Week 5)  Calcium Carbonate-Vitamin D 600-400 Mg-Unit Tabs (Calcium Carbonate-Vitamin D) .... One By Mouth  Bid 6)  Zithromax Z-Pak 250 Mg Tabs (Azithromycin) .... As Directed1 7)  Hydromet 5-1.5 Mg/43ml Syrp (Hydrocodone-Homatropine) .... One Teaspoon Every 8 Hours As Needed Cough  Current Medications (verified): 1)  Zocor 40 Mg  Tabs (Simvastatin) .... One By Mouth Daily 2)  Mobic 15 Mg  Tabs (Meloxicam) .... Take 1 Tablet By Mouth Once A Day 3)  Fish Oil 1000 Mg  Caps (Omega-3 Fatty Acids) .Marland Kitchen.. 1 Once Daily 4)  Ergocalciferol 50000 Unit Caps (Ergocalciferol) .Marland Kitchen.. 1 Every Week 5)  Calcium Carbonate-Vitamin D 600-400 Mg-Unit Tabs (Calcium Carbonate-Vitamin D) .... One By Mouth  Bid 6)  Zithromax Z-Pak 250 Mg Tabs (Azithromycin) .... As Directed1 7)  Hydromet 5-1.5 Mg/70ml Syrp (Hydrocodone-Homatropine) .... One Teaspoon Every 8 Hours As Needed Cough  Allergies (verified): No Known Drug Allergies  Past History:  Family History: Last updated: 11/12/2006 Fam hx MI Family History Diabetes 1st degree relative Family History Hypertension Family History of Cardiovascular disorder  Social History: Last updated: 01/07/2007 Retired Married Never Smoked Alcohol use-no Drug use-no  Risk Factors: Smoking Status: never (12/06/2009)  Past medical, surgical, family and social histories (including risk factors) reviewed, and  no changes noted (except as noted below).  Past Medical History: Reviewed history from 01/07/2007 and no changes required. Allergic rhinitis Hyperlipidemia Osteoarthritis  Past Surgical History: Reviewed history from 11/12/2006 and no changes required. Appendectomy Laparotomy-exploratory Tonsillectomy Foot sx-bilat  Family History: Reviewed history from 11/12/2006 and no changes required. Fam hx MI Family History Diabetes 1st degree  relative Family History Hypertension Family History of Cardiovascular disorder  Social History: Reviewed history from 01/07/2007 and no changes required. Retired Married Never Smoked Alcohol use-no Drug use-no  Review of Systems       The patient complains of hoarseness, dyspnea on exertion, and prolonged cough.  The patient denies anorexia, fever, weight loss, weight gain, vision loss, decreased hearing, chest pain, syncope, peripheral edema, headaches, hemoptysis, abdominal pain, melena, hematochezia, severe indigestion/heartburn, hematuria, incontinence, genital sores, muscle weakness, suspicious skin lesions, transient blindness, difficulty walking, depression, unusual weight change, abnormal bleeding, enlarged lymph nodes, angioedema, and breast masses.    Physical Exam  General:  average weight, good nutrition, and meticulous grooming.   Head:  Normocephalic and atraumatic without obvious abnormalities. No apparent alopecia or balding. Ears:  R ear normal and L ear normal.   Nose:  no external deformity and no nasal discharge.   Mouth:  Oral mucosa and oropharynx without lesions or exudates.  Teeth in good repair. Neck:  supple.   Lungs:  egophany right upper lobe decreased BS  Heart:  normal rate and regular rhythm.   Abdomen:  soft, non-tender, and normal bowel sounds.   Msk:  decreased ROM, joint tenderness, and joint swelling.   Neurologic:  alert & oriented X3 and finger-to-nose normal.     Impression & Recommendations:  Problem # 1:  PNEUMONIA, RIGHT UPPER LOBE (ICD-486) Assessment Deteriorated add 5 days of avelox and mucinex and pm codine cought meds I have spent greater that 30 min face to face evaluating this patient 1/2 in coucilling Her updated medication list for this problem includes:    Avelox 400 Mg Tabs (Moxifloxacin hcl) ..... One by mouth dialy for 5 day  nstructed patient to complete antibiotics, and call for worsened shortness of breath or new  symptoms.   Problem # 2:  COUGH (ICD-786.2) Assessment: Unchanged secondary to this Orders: Rocephin  250mg  (Z6109) Admin of Therapeutic Inj  intramuscular or subcutaneous (60454)  Complete Medication List: 1)  Zocor 40 Mg Tabs (Simvastatin) .... One by mouth daily 2)  Mobic 15 Mg Tabs (Meloxicam) .... Take 1 tablet by mouth once a day 3)  Fish Oil 1000 Mg Caps (Omega-3 fatty acids) .Marland Kitchen.. 1 once daily 4)  Ergocalciferol 50000 Unit Caps (Ergocalciferol) .Marland Kitchen.. 1 every week 5)  Calcium Carbonate-vitamin D 600-400 Mg-unit Tabs (Calcium carbonate-vitamin d) .... One by mouth  bid 6)  Avelox 400 Mg Tabs (Moxifloxacin hcl) .... One by mouth dialy for 5 day 7)  Hydromet 5-1.5 Mg/12ml Syrp (Hydrocodone-homatropine) .... One teaspoon every 8 hours as needed cough  Patient Instructions: 1)  Please schedule a follow-up appointment as needed. 2)  Take your antibiotic as prescribed until ALL of it is gone, but stop if you develop a rash or swelling and contact our office as soon as possible.   Medication Administration  Injection # 1:    Medication: Rocephin  250mg     Diagnosis: COUGH (ICD-786.2)    Route: IM    Site: RUOQ gluteus    Exp Date: 08/16/2012    Lot #: [id number]    Mfr: Novartis    Comments: 1gm  given    Patient tolerated injection without complications    Given by: Willy Eddy, LPN (December 06, 2009 2:09 PM)  Orders Added: 1)  Rocephin  250mg  [J0696] 2)  Admin of Therapeutic Inj  intramuscular or subcutaneous [96372] 3)  Est. Patient Level IV [16109]

## 2010-05-27 ENCOUNTER — Ambulatory Visit: Payer: Self-pay | Admitting: Internal Medicine

## 2010-06-02 ENCOUNTER — Encounter: Payer: Self-pay | Admitting: Internal Medicine

## 2010-06-06 ENCOUNTER — Ambulatory Visit (INDEPENDENT_AMBULATORY_CARE_PROVIDER_SITE_OTHER): Payer: No Typology Code available for payment source | Admitting: Internal Medicine

## 2010-06-06 ENCOUNTER — Encounter: Payer: Self-pay | Admitting: Internal Medicine

## 2010-06-06 VITALS — BP 124/70 | HR 72 | Temp 97.8°F | Resp 14 | Ht 64.0 in | Wt 166.0 lb

## 2010-06-06 DIAGNOSIS — T887XXA Unspecified adverse effect of drug or medicament, initial encounter: Secondary | ICD-10-CM

## 2010-06-06 DIAGNOSIS — E785 Hyperlipidemia, unspecified: Secondary | ICD-10-CM

## 2010-06-06 DIAGNOSIS — M810 Age-related osteoporosis without current pathological fracture: Secondary | ICD-10-CM

## 2010-06-06 LAB — LIPID PANEL
HDL: 41.9 mg/dL (ref >39.00)
LDL Cholesterol: 108 mg/dL — ABNORMAL HIGH (ref 0–99)
VLDL: 19.4 mg/dL (ref 0.0–40.0)

## 2010-06-06 LAB — HEPATIC FUNCTION PANEL
Bilirubin, Direct: 0.1 mg/dL (ref 0.0–0.3)
Total Bilirubin: 0.5 mg/dL (ref 0.3–1.2)

## 2010-06-06 LAB — TSH: TSH: 2.39 u[IU]/mL (ref 0.35–5.50)

## 2010-06-06 NOTE — Assessment & Plan Note (Signed)
The pt is due liver to monitor drug side effects

## 2010-06-06 NOTE — Patient Instructions (Signed)
You need to return to the [facility] and exercises 3 times a week

## 2010-06-06 NOTE — Assessment & Plan Note (Signed)
Due bone density and reviewed vit d levels

## 2010-06-06 NOTE — Progress Notes (Signed)
Subjective:    Patient ID: Sherry Watts, female    DOB: 10-18-1921, 77 y.o.   MRN: [id number]  HPI  patient is a 77 year old African American female who is followed for hyperlipidemia  the patient is compliant with her hyperlipidemic medications but has not been exercising and has not been following her diet as she should she admits that since her daughter's Hospital that she has eaten out and has not exercises.  She has a Research scientist (physical sciences) at [employer] we discussed reactivating that membership and going to the [employer] at least 3 times a week.   she has taken her vitamin D. And has a bone density scheduled for June.  She denies any chest pain increased shortness of breath or edema in her lower extremities   Review of Systems  Constitutional: Negative for activity change, appetite change and fatigue.  HENT: Negative for ear pain, congestion, neck pain, postnasal drip and sinus pressure.   Eyes: Negative for redness and visual disturbance.  Respiratory: Negative for cough, shortness of breath and wheezing.   Gastrointestinal: Negative for abdominal pain and abdominal distention.  Genitourinary: Negative for dysuria, frequency and menstrual problem.  Musculoskeletal: Negative for myalgias, joint swelling and arthralgias.  Skin: Negative for rash and wound.  Neurological: Negative for dizziness, weakness and headaches.  Hematological: Negative for adenopathy. Does not bruise/bleed easily.  Psychiatric/Behavioral: Negative for sleep disturbance and decreased concentration.       Past Medical History  Diagnosis Date  . Allergy   . Hyperlipidemia   . Arthritis    Past Surgical History  Procedure Date  . Appendectomy   . Exploratory laparotomy   . Tonsillectomy   . Foot fx      reports that she has never smoked. She does not have any smokeless tobacco history on file. She reports that she does not drink alcohol or use illicit drugs. family history includes Diabetes in her daughter; Heart  attack in an unspecified family member; Heart disease in her mother; and Hypertension in her mother. No Known Allergies  Objective:   Physical Exam  Constitutional: She is oriented to person, place, and time. She appears well-developed and well-nourished. No distress.  HENT:  Head: Normocephalic and atraumatic.  Right Ear: External ear normal.  Left Ear: External ear normal.  Nose: Nose normal.  Mouth/Throat: Oropharynx is clear and moist.  Eyes: Conjunctivae and EOM are normal. Pupils are equal, round, and reactive to light.  Neck: Normal range of motion. Neck supple. No JVD present. No tracheal deviation present. No thyromegaly present.  Cardiovascular: Normal rate, regular rhythm, normal heart sounds and intact distal pulses.   No murmur heard. Pulmonary/Chest: Effort normal and breath sounds normal. She has no wheezes. She exhibits no tenderness.  Abdominal: Soft. Bowel sounds are normal.  Musculoskeletal: Normal range of motion. She exhibits no edema and no tenderness.  Lymphadenopathy:    She has no cervical adenopathy.  Neurological: She is alert and oriented to person, place, and time. She has normal reflexes. No cranial nerve deficit.  Skin: Skin is warm and dry. She is not diaphoretic.  Psychiatric: She has a normal mood and affect. Her behavior is normal.          Assessment & Plan:   we have scheduled her for a bone density in June she will followup in 6 months for a Medicare wellness examination we have reinforced the need to exercise on a daily basis and to go to the [facility] for weight training 3  times a week to maintain her balance and health.  She is compliant with all of her medications we reviewed the need for refills and discussed her problems in detail with the patient .

## 2010-06-07 LAB — VITAMIN D 25 HYDROXY (VIT D DEFICIENCY, FRACTURES): Vit D, 25-Hydroxy: 60 ng/mL (ref 30–89)

## 2010-08-05 NOTE — Assessment & Plan Note (Signed)
[facility]                           GASTROENTEROLOGY OFFICE NOTE   KJIRSTEN, BLOODGOOD                         MRN:          [id number]  DATE:01/11/2006                            DOB:          Jul 01, 1921    REASON FOR REFERRAL:  Colorectal cancer screening.   HISTORY OF PRESENT ILLNESS:  Ms. Wessner is a very nice 77 year old African-  American female referred through the courtesy of Dr. Lovell Sheehan for  consideration of colonoscopy.  She is in very good health overall.  She  states she has had flexible sigmoidoscopies by Dr. Lovell Sheehan approximately  every 5 years with no abnormalities noted.  She has no colorectal complaints  and specifically denies any change in bowel habits, change in stool caliber,  constipation, diarrhea, melena, hematochezia, abdominal pain or rectal pain.  She has noted small external hemorrhoids for years but they are  asymptomatic.  No family history of colon cancer, colon polyps or  inflammatory bowel disease.   PAST MEDICAL HISTORY:  1. Hyperlipidemia.  2. Arthritis.  3. Status post appendectomy.  4. Status post tonsillectomy.  5. Allergic rhinitis.  6. Hemorrhoids.   CURRENT MEDICATIONS:  Listed on the chart, updated and reviewed.   MEDICATION ALLERGIES:  None known.   SOCIAL HISTORY AND REVIEW OF SYSTEMS:  Per the handwritten form.   PHYSICAL EXAMINATION:  GENERAL:  Well-developed, well-nourished African-  American female appearing much younger than her stated age.  VITAL SIGNS:  Height 5 feet 4 inches, weight 167.4 pounds, blood pressure is  140/60, pulse 78 and regular.  HEENT:  Anicteric sclerae, oropharynx clear.  CHEST:  Clear to auscultation bilaterally.  CARDIAC:  Regular rate and rhythm without murmurs.  ABDOMEN:  Soft, nontender, nondistended.  Normal active bowel sounds.  No  palpable organomegaly, masses, or hernias.  RECTAL:  Deferred to time of colonoscopy.  EXTREMITIES:  Without clubbing, cyanosis,  or edema.  NEUROLOGIC:  Alert and oriented x3.  Grossly nonfocal.   LABORATORY DATA:  From December 18, 2005:  CBC and CMET were normal.  Cholesterol mildly elevated at 202.  TSH normal.   ASSESSMENT AND PLAN:  Average risk for colorectal cancer in an 77 year old  female who appears much younger than her stated age.  Risks, benefits and  alternatives to colonoscopy with possible biopsy and possible polypectomy  discussed with the patient and she consents to proceed.  This will be  scheduled electively.     Venita Lick. Russella Dar, MD, Clementeen Graham    MTS/MedQ  DD: 01/11/2006  DT: 01/12/2006  Job #: [id number]   cc:   Stacie Glaze, MD

## 2010-08-08 ENCOUNTER — Other Ambulatory Visit: Payer: Self-pay | Admitting: Internal Medicine

## 2010-09-12 ENCOUNTER — Other Ambulatory Visit: Payer: Self-pay | Admitting: *Deleted

## 2010-09-12 DIAGNOSIS — Z780 Asymptomatic menopausal state: Secondary | ICD-10-CM

## 2010-09-13 ENCOUNTER — Ambulatory Visit (INDEPENDENT_AMBULATORY_CARE_PROVIDER_SITE_OTHER)
Admission: RE | Admit: 2010-09-13 | Discharge: 2010-09-13 | Disposition: A | Discharge status: Home / Self Care | Payer: No Typology Code available for payment source | Source: Ambulatory Visit

## 2010-09-13 DIAGNOSIS — Z780 Asymptomatic menopausal state: Secondary | ICD-10-CM

## 2010-09-13 DIAGNOSIS — M810 Age-related osteoporosis without current pathological fracture: Secondary | ICD-10-CM

## 2010-09-23 ENCOUNTER — Encounter: Payer: Self-pay | Admitting: Internal Medicine

## 2010-10-13 ENCOUNTER — Other Ambulatory Visit: Payer: Self-pay | Admitting: Internal Medicine

## 2010-10-24 ENCOUNTER — Telehealth: Payer: Self-pay | Admitting: *Deleted

## 2010-10-24 NOTE — Telephone Encounter (Signed)
Work in Advertising account executive at [employer] 

## 2010-10-24 NOTE — Telephone Encounter (Signed)
Pt has had 3 episodes of nausea and near fainting since yesterday.  Comes on suddenly, and she has to lie down before she faints.  Her daughter passed away recently, and she REALLY thinks she needs to see Dr. Lovell Sheehan, she states

## 2010-10-24 NOTE — Telephone Encounter (Signed)
Pt. Notified.

## 2010-10-25 ENCOUNTER — Ambulatory Visit (INDEPENDENT_AMBULATORY_CARE_PROVIDER_SITE_OTHER): Payer: No Typology Code available for payment source | Admitting: Internal Medicine

## 2010-10-25 VITALS — BP 142/80 | HR 72 | Temp 98.6°F | Resp 16 | Ht 64.0 in | Wt 168.0 lb

## 2010-10-25 DIAGNOSIS — R42 Dizziness and giddiness: Secondary | ICD-10-CM

## 2010-10-25 DIAGNOSIS — R110 Nausea: Secondary | ICD-10-CM

## 2010-10-25 DIAGNOSIS — R55 Syncope and collapse: Secondary | ICD-10-CM

## 2010-10-25 DIAGNOSIS — B9789 Other viral agents as the cause of diseases classified elsewhere: Secondary | ICD-10-CM

## 2010-10-25 DIAGNOSIS — B349 Viral infection, unspecified: Secondary | ICD-10-CM

## 2010-10-25 LAB — CBC WITH DIFFERENTIAL/PLATELET
Basophils Absolute: 0 10*3/uL (ref 0.0–0.1)
Eosinophils Absolute: 0.1 10*3/uL (ref 0.0–0.7)
Hemoglobin: 13.4 g/dL (ref 12.0–15.0)
Lymphocytes Relative: 25.1 % (ref 12.0–46.0)
Lymphs Abs: 1.9 10*3/uL (ref 0.7–4.0)
MCHC: 32.8 g/dL (ref 30.0–36.0)
MCV: 95.5 fl (ref 78.0–100.0)
Monocytes Absolute: 0.6 10*3/uL (ref 0.1–1.0)
Neutro Abs: 4.9 10*3/uL (ref 1.4–7.7)
RDW: 14.5 % (ref 11.5–14.6)

## 2010-10-25 LAB — BASIC METABOLIC PANEL
BUN: 18 mg/dL (ref 6–23)
Calcium: 9.4 mg/dL (ref 8.4–10.5)
Chloride: 108 mEq/L (ref 96–112)
Creatinine, Ser: 1 mg/dL (ref 0.4–1.2)
GFR: 65.24 mL/min (ref >60.00)

## 2010-10-25 MED ORDER — PROMETHAZINE HCL 50 MG PO TABS: 25.0000 mg | ORAL_TABLET | Freq: Four times a day (QID) | ORAL | Status: AC | PRN

## 2010-10-25 NOTE — Progress Notes (Signed)
Subjective:    Patient ID: Sherry Watts, female    DOB: 1922/01/30, 77 y.o.   MRN: [id number]  HPI Patient had the sudden onset of symptoms on Sunday of lightheadedness and nausea and a possible short loss of consciousness. She did not fall because she made it to a recliner in time before she had any loss of consciousness.  She had nausea but reports no diarrhea fever or other infectious symptomatology. She has detected an odor to her urine but no burning. She had severe nausea but she did not vomit.  The symptoms recurred on Monday but they were slightly improved. And there has been slight improvement this morning as well   Review of Systems  Constitutional: Positive for fatigue. Negative for activity change and appetite change.  HENT: Negative for ear pain, congestion, neck pain, postnasal drip and sinus pressure.   Eyes: Negative for redness and visual disturbance.  Respiratory: Negative for cough, shortness of breath and wheezing.   Gastrointestinal: Positive for nausea and abdominal distention. Negative for abdominal pain.  Genitourinary: Positive for decreased urine volume. Negative for dysuria, frequency and menstrual problem.  Musculoskeletal: Negative for myalgias, joint swelling and arthralgias.  Skin: Negative for rash and wound.  Neurological: Positive for syncope and weakness. Negative for dizziness and headaches.  Hematological: Negative for adenopathy. Does not bruise/bleed easily.  Psychiatric/Behavioral: Negative for sleep disturbance and decreased concentration.   Past Medical History  Diagnosis Date  . Allergy   . Hyperlipidemia   . Arthritis    Past Surgical History  Procedure Date  . Appendectomy   . Exploratory laparotomy   . Tonsillectomy   . Foot fx      reports that she has never smoked. She does not have any smokeless tobacco history on file. She reports that she does not drink alcohol or use illicit drugs. family history includes Diabetes in her  daughter; Heart attack in an unspecified family member; Heart disease in her mother; and Hypertension in her mother. No Known Allergies      Objective:   Physical Exam  Nursing note and vitals reviewed. Constitutional: She is oriented to person, place, and time. She appears well-developed and well-nourished. No distress.       Skin is cool and clammy  HENT:  Head: Normocephalic and atraumatic.  Right Ear: External ear normal.  Left Ear: External ear normal.  Nose: Nose normal.  Mouth/Throat: Oropharynx is clear and moist.  Eyes: Conjunctivae and EOM are normal. Pupils are equal, round, and reactive to light.  Neck: Normal range of motion. Neck supple. No JVD present. No tracheal deviation present. No thyromegaly present.  Cardiovascular: Normal rate, regular rhythm, normal heart sounds and intact distal pulses.   No murmur heard. Pulmonary/Chest: Effort normal and breath sounds normal. She has no wheezes. She exhibits no tenderness.  Abdominal: Soft. Bowel sounds are normal.  Musculoskeletal: Normal range of motion. She exhibits no edema and no tenderness.  Lymphadenopathy:    She has no cervical adenopathy.  Neurological: She is alert and oriented to person, place, and time. She has normal reflexes. No cranial nerve deficit.  Skin: Skin is warm and dry. She is not diaphoretic.  Psychiatric: She has a normal mood and affect. Her behavior is normal.          Assessment & Plan:  The working diagnosis is that she had a viral syndrome however I am concerned that we must limited other etiologies a CBC with differential and a basic metabolic  panel we drawn today I recommended she go on a clear liquid diet for 24 hours consisting mainly of ginger ale water Jell-O or other clear liquids making sure to get some nutrition from fruit juices such as apple juice during this period of time.  He may use applesauce well the clear liquid diet  Wants the symptoms seem to fully abate he can  gradually introduce solids into your diet but be careful not to start greasy foods fried foods for about one week

## 2010-10-27 ENCOUNTER — Ambulatory Visit: Payer: No Typology Code available for payment source | Admitting: Internal Medicine

## 2010-10-31 ENCOUNTER — Ambulatory Visit: Payer: No Typology Code available for payment source | Admitting: Internal Medicine

## 2010-11-03 ENCOUNTER — Encounter: Payer: Self-pay | Admitting: Internal Medicine

## 2010-11-16 ENCOUNTER — Other Ambulatory Visit: Payer: Self-pay | Admitting: Internal Medicine

## 2010-12-09 ENCOUNTER — Encounter: Payer: No Typology Code available for payment source | Admitting: Internal Medicine

## 2010-12-21 ENCOUNTER — Encounter: Payer: Self-pay | Admitting: Internal Medicine

## 2010-12-21 ENCOUNTER — Ambulatory Visit (INDEPENDENT_AMBULATORY_CARE_PROVIDER_SITE_OTHER): Payer: No Typology Code available for payment source | Admitting: Internal Medicine

## 2010-12-21 VITALS — BP 140/80 | HR 72 | Temp 98.2°F | Resp 16 | Ht 64.0 in | Wt 172.0 lb

## 2010-12-21 DIAGNOSIS — T887XXA Unspecified adverse effect of drug or medicament, initial encounter: Secondary | ICD-10-CM

## 2010-12-21 DIAGNOSIS — R300 Dysuria: Secondary | ICD-10-CM

## 2010-12-21 DIAGNOSIS — E785 Hyperlipidemia, unspecified: Secondary | ICD-10-CM

## 2010-12-21 DIAGNOSIS — Z23 Encounter for immunization: Secondary | ICD-10-CM

## 2010-12-21 DIAGNOSIS — M810 Age-related osteoporosis without current pathological fracture: Secondary | ICD-10-CM

## 2010-12-21 DIAGNOSIS — Z0000 Encounter for general adult medical examination without abnormal findings: Secondary | ICD-10-CM

## 2010-12-21 DIAGNOSIS — M1990 Unspecified osteoarthritis, unspecified site: Secondary | ICD-10-CM

## 2010-12-21 LAB — POCT URINALYSIS DIPSTICK
Bilirubin, UA: NEGATIVE
Glucose, UA: NEGATIVE
Ketones, UA: NEGATIVE
Protein, UA: NEGATIVE

## 2010-12-21 LAB — HEPATIC FUNCTION PANEL
AST: 24 U/L (ref 0–37)
Alkaline Phosphatase: 61 U/L (ref 39–117)
Bilirubin, Direct: 0 mg/dL (ref 0.0–0.3)
Total Bilirubin: 0.7 mg/dL (ref 0.3–1.2)

## 2010-12-21 LAB — CBC WITH DIFFERENTIAL/PLATELET
Basophils Absolute: 0 10*3/uL (ref 0.0–0.1)
Eosinophils Relative: 2.1 % (ref 0.0–5.0)
Lymphs Abs: 2 10*3/uL (ref 0.7–4.0)
Monocytes Absolute: 0.5 10*3/uL (ref 0.1–1.0)
Monocytes Relative: 7.6 % (ref 3.0–12.0)
Neutrophils Relative %: 56.5 % (ref 43.0–77.0)
Platelets: 173 10*3/uL (ref 150.0–400.0)
RDW: 14.3 % (ref 11.5–14.6)
WBC: 6 10*3/uL (ref 4.5–10.5)

## 2010-12-21 LAB — LIPID PANEL
HDL: 46.4 mg/dL (ref >39.00)
LDL Cholesterol: 108 mg/dL — ABNORMAL HIGH (ref 0–99)
VLDL: 28.6 mg/dL (ref 0.0–40.0)

## 2010-12-21 LAB — BASIC METABOLIC PANEL
Calcium: 9.4 mg/dL (ref 8.4–10.5)
GFR: 69.89 mL/min (ref >60.00)
Potassium: 4.1 mEq/L (ref 3.5–5.1)
Sodium: 143 mEq/L (ref 135–145)

## 2010-12-21 LAB — TSH: TSH: 1.11 u[IU]/mL (ref 0.35–5.50)

## 2010-12-21 MED ORDER — MELOXICAM 15 MG PO TABS: 15.0000 mg | ORAL_TABLET | Freq: Every day | ORAL | Status: DC | PRN

## 2010-12-21 MED ORDER — SIMVASTATIN 40 MG PO TABS: 40.0000 mg | ORAL_TABLET | Freq: Every day | ORAL | Status: DC

## 2010-12-21 MED ORDER — OMEGA-3 FATTY ACIDS 1000 MG PO CAPS: 2.0000 g | ORAL_CAPSULE | Freq: Every day | ORAL | Status: DC

## 2010-12-21 NOTE — Patient Instructions (Signed)
Patient was instructed to continue all medications as prescribed. To stop at the checkout desk and schedule a followup appointment  

## 2010-12-21 NOTE — Progress Notes (Signed)
Subjective:    Patient ID: Sherry Watts, female    DOB: 30-Dec-1921, 77 y.o.   MRN: [id number]  HPI Patient is an 77 year old female who presents for a complete physical examination.  She is fasting for blood work today.  Her comorbid problems include hyperlipidemia osteoarthritis history of osteoporosis/osteopenia on calcium and vitamin D as well as exercise and a history of allergic rhinitis.  Her chief complaint today involves dish urea and a foul smell to her urine a urinalysis will be obtained she states that when she increased her fluid intake the urine smell improved suggesting some dehydration.  She has been grieving recently with the loss of her daughter and is moving forward in a resolution pattern it is appropriate   Review of Systems  Constitutional: Negative for activity change, appetite change and fatigue.  HENT: Negative for ear pain, congestion, neck pain, postnasal drip and sinus pressure.   Eyes: Negative for redness and visual disturbance.  Respiratory: Negative for cough, shortness of breath and wheezing.   Gastrointestinal: Negative for abdominal pain and abdominal distention.  Genitourinary: Negative for dysuria, frequency and menstrual problem.  Musculoskeletal: Negative for myalgias, joint swelling and arthralgias.  Skin: Negative for rash and wound.  Neurological: Negative for dizziness, weakness and headaches.  Hematological: Negative for adenopathy. Does not bruise/bleed easily.  Psychiatric/Behavioral: Negative for sleep disturbance and decreased concentration.       Objective:   Physical Exam  Nursing note and vitals reviewed. Constitutional: She is oriented to person, place, and time. She appears well-developed and well-nourished. No distress.  HENT:  Head: Normocephalic and atraumatic.  Right Ear: External ear normal.  Left Ear: External ear normal.  Nose: Nose normal.  Mouth/Throat: Oropharynx is clear and moist.  Eyes: Conjunctivae and EOM are  normal. Pupils are equal, round, and reactive to light.  Neck: Normal range of motion. Neck supple. No JVD present. No tracheal deviation present. No thyromegaly present.  Cardiovascular: Normal rate, regular rhythm, normal heart sounds and intact distal pulses.   No murmur heard. Pulmonary/Chest: Effort normal and breath sounds normal. She has no wheezes. She exhibits no tenderness.  Abdominal: Soft. Bowel sounds are normal.  Musculoskeletal: Normal range of motion. She exhibits no edema and no tenderness.  Lymphadenopathy:    She has no cervical adenopathy.  Neurological: She is alert and oriented to person, place, and time. She has normal reflexes. No cranial nerve deficit.  Skin: Skin is warm and dry. She is not diaphoretic.  Psychiatric: She has a normal mood and affect. Her behavior is normal.          Assessment & Plan:   This is a routine physical examination for this healthy  Female. Reviewed all health maintenance protocols including mammography colonoscopy bone density and reviewed appropriate screening labs. Her immunization history was reviewed as well as her current medications and allergies refills of her chronic medications were given and the plan for yearly health maintenance was discussed all orders and referrals  She will continue the fish oil and a lipid panel will be monitored today with a history of anemia CBC differential will be monitored for her renal function a basic metabolic panel will be monitored and for her history of hyperlipidemia liver function should be monitored to make sure there is no fatty infiltration of the liver.  Allergic rhinitis is stable her osteoporosis is stable but vitamin D will be monitored today and reviewed her recent improved bone density.  She should consider continuing the  calcium supplementation and we will adjust the vitamin D supplementation based upon the levels of pain

## 2010-12-22 LAB — VITAMIN D 25 HYDROXY (VIT D DEFICIENCY, FRACTURES): Vit D, 25-Hydroxy: 62 ng/mL (ref 30–89)

## 2011-03-10 ENCOUNTER — Other Ambulatory Visit: Payer: Self-pay | Admitting: Internal Medicine

## 2011-03-13 ENCOUNTER — Other Ambulatory Visit: Payer: Self-pay | Admitting: Internal Medicine

## 2011-04-15 ENCOUNTER — Other Ambulatory Visit: Payer: Self-pay | Admitting: Internal Medicine

## 2011-04-24 ENCOUNTER — Ambulatory Visit (INDEPENDENT_AMBULATORY_CARE_PROVIDER_SITE_OTHER): Payer: Medicare HMO | Admitting: Internal Medicine

## 2011-04-24 ENCOUNTER — Encounter: Payer: Self-pay | Admitting: Internal Medicine

## 2011-04-24 VITALS — BP 134/78 | HR 72 | Temp 98.2°F | Resp 16 | Ht 64.0 in | Wt 172.0 lb

## 2011-04-24 DIAGNOSIS — M1990 Unspecified osteoarthritis, unspecified site: Secondary | ICD-10-CM

## 2011-04-24 DIAGNOSIS — R8290 Unspecified abnormal findings in urine: Secondary | ICD-10-CM

## 2011-04-24 DIAGNOSIS — E785 Hyperlipidemia, unspecified: Secondary | ICD-10-CM

## 2011-04-24 DIAGNOSIS — R82998 Other abnormal findings in urine: Secondary | ICD-10-CM

## 2011-04-24 DIAGNOSIS — K2970 Gastritis, unspecified, without bleeding: Secondary | ICD-10-CM

## 2011-04-24 LAB — CBC WITH DIFFERENTIAL/PLATELET
Basophils Absolute: 0 10*3/uL (ref 0.0–0.1)
Basophils Relative: 0.6 % (ref 0.0–3.0)
Eosinophils Absolute: 0.2 10*3/uL (ref 0.0–0.7)
Lymphocytes Relative: 28.9 % (ref 12.0–46.0)
MCHC: 32.8 g/dL (ref 30.0–36.0)
Neutrophils Relative %: 59 % (ref 43.0–77.0)
RBC: 4.42 Mil/uL (ref 3.87–5.11)
RDW: 14.1 % (ref 11.5–14.6)

## 2011-04-24 LAB — POCT URINALYSIS DIPSTICK
Blood, UA: NEGATIVE
Glucose, UA: NEGATIVE
Leukocytes, UA: NEGATIVE
Nitrite, UA: NEGATIVE
Urobilinogen, UA: 0.2

## 2011-04-24 MED ORDER — OMEPRAZOLE 20 MG PO CPDR: 20.0000 mg | DELAYED_RELEASE_CAPSULE | Freq: Every day | ORAL | Status: DC

## 2011-04-24 NOTE — Progress Notes (Signed)
Subjective:    Patient ID: Sherry Watts, female    DOB: 06/20/1921, 79 y.o.   MRN: [id number]  HPI Patient is an 78 year old female who presents for followup of hyperlipidemia and osteoarthritis.  She has a new complaint of a "griping" stomach or woozy sensation in the mid epigastric region that is intermittent she cannot relate it to food or meals but it does give her a sense of uneasiness. She notices an odor in her urine but has had no dark stools or diarrhea.  She has shows no lightheadedness. She is taking meloxicam for her arthritis.  As well as simvastatin and  for cholesterol.   Review of Systems  Constitutional: Negative for activity change, appetite change and fatigue.  HENT: Negative for ear pain, congestion, neck pain, postnasal drip and sinus pressure.   Eyes: Negative for redness and visual disturbance.  Respiratory: Negative for cough, shortness of breath and wheezing.   Gastrointestinal: Positive for abdominal pain and abdominal distention.  Genitourinary: Positive for frequency. Negative for dysuria and menstrual problem.  Musculoskeletal: Negative for myalgias, joint swelling and arthralgias.  Skin: Negative for rash and wound.  Neurological: Negative for dizziness, weakness and headaches.  Hematological: Negative for adenopathy. Does not bruise/bleed easily.  Psychiatric/Behavioral: Negative for sleep disturbance and decreased concentration.   Past Medical History  Diagnosis Date  . Allergy   . Hyperlipidemia   . Arthritis     History   Social History  . Marital Status: Widowed    Spouse Name: N/A    Number of Children: N/A  . Years of Education: N/A   Occupational History  . retired    Social History Main Topics  . Smoking status: Never Smoker   . Smokeless tobacco: Not on file  . Alcohol Use: No  . Drug Use: No  . Sexually Active: No   Other Topics Concern  . Not on file   Social History Narrative  . No narrative on file    Past  Surgical History  Procedure Date  . Appendectomy   . Exploratory laparotomy   . Tonsillectomy   . Foot fx      Family History  Problem Relation Age of Onset  . Heart attack    . Heart disease Mother   . Hypertension Mother   . Diabetes Daughter     No Known Allergies  Current Outpatient Prescriptions on File Prior to Visit  Medication Sig Dispense Refill  . calcium carbonate (OS-CAL) 600 MG TABS Take 600 mg by mouth 2 (two) times daily with a meal.        . ergocalciferol (VITAMIN D2) 50000 UNITS capsule take 1 capsule by mouth every week  5 capsule  2  . fish oil-omega-3 fatty acids 1000 MG capsule Take 2 capsules (2 g total) by mouth daily.      . meloxicam (MOBIC) 15 MG tablet TAKE 1 TABLET BY MOUTH   EVERY DAY  30 tablet  4  . simvastatin (ZOCOR) 40 MG tablet Take 1 tablet (40 mg total) by mouth at bedtime.  90 tablet  0    BP 134/78  Pulse 72  Temp 98.2 F (36.8 C)  Resp 16  Ht 5\' 4"  (1.626 m)  Wt 172 lb (78.019 kg)  BMI 29.52 kg/m2       Objective:   Physical Exam  Nursing note and vitals reviewed. Constitutional: She is oriented to person, place, and time. She appears well-developed and well-nourished. No distress.  HENT:  Head: Normocephalic and atraumatic.  Right Ear: External ear normal.  Left Ear: External ear normal.  Nose: Nose normal.  Mouth/Throat: Oropharynx is clear and moist.  Eyes: Conjunctivae and EOM are normal. Pupils are equal, round, and reactive to light.  Neck: Normal range of motion. Neck supple. No JVD present. No tracheal deviation present. No thyromegaly present.  Cardiovascular: Normal rate, regular rhythm, normal heart sounds and intact distal pulses.   No murmur heard. Pulmonary/Chest: Effort normal and breath sounds normal. She has no wheezes. She exhibits no tenderness.  Abdominal: Soft. Bowel sounds are normal.  Musculoskeletal: Normal range of motion. She exhibits no edema and no tenderness.  Lymphadenopathy:    She has no  cervical adenopathy.  Neurological: She is alert and oriented to person, place, and time. She has normal reflexes. No cranial nerve deficit.  Skin: Skin is warm and dry. She is not diaphoretic.  Psychiatric: She has a normal mood and affect. Her behavior is normal.          Assessment & Plan:  Discontinue the meloxicam and put her on a proton pump inhibitor to see if we can control her GI symptoms her symptoms may be indicative of early ulcer disease.  We will draw appropriate blood work today which will include her renal function and a CBC differential

## 2011-04-24 NOTE — Patient Instructions (Signed)
The patient is instructed to continue all medications as prescribed. Schedule followup with check out clerk upon leaving the clinic  

## 2011-04-28 ENCOUNTER — Telehealth: Payer: Self-pay | Admitting: *Deleted

## 2011-04-28 NOTE — Telephone Encounter (Signed)
Dr jenkins is aware 

## 2011-04-28 NOTE — Telephone Encounter (Signed)
Pt wants Dr. Lovell Sheehan to know the Prilosec is working great.  No problems since starting it.

## 2011-05-11 ENCOUNTER — Telehealth: Payer: Self-pay | Admitting: *Deleted

## 2011-05-11 NOTE — Telephone Encounter (Signed)
See if padonda can see her

## 2011-05-11 NOTE — Telephone Encounter (Signed)
Pt states she feels fine and just foget the appt.

## 2011-05-11 NOTE — Telephone Encounter (Signed)
Pt is complaining of URI, cough, runny nose, and has a rattle in chest.  Not feeling very bad. Taking Robitussin, but is not helping

## 2011-05-12 ENCOUNTER — Ambulatory Visit (INDEPENDENT_AMBULATORY_CARE_PROVIDER_SITE_OTHER): Payer: Medicare HMO | Admitting: Family

## 2011-05-12 ENCOUNTER — Encounter: Payer: Self-pay | Admitting: Family

## 2011-05-12 DIAGNOSIS — J309 Allergic rhinitis, unspecified: Secondary | ICD-10-CM

## 2011-05-12 DIAGNOSIS — J069 Acute upper respiratory infection, unspecified: Secondary | ICD-10-CM

## 2011-05-12 MED ORDER — METHYLPREDNISOLONE ACETATE 40 MG/ML IJ SUSP
40.0000 mg | Freq: Once | INTRAMUSCULAR | Status: AC
  Administered 2011-05-12: 40 mg via INTRAMUSCULAR

## 2011-05-12 NOTE — Progress Notes (Signed)
Subjective:    Patient ID: Sherry Watts, female    DOB: 04-03-1921, 78 y.o.   MRN: [id number]  HPI An 78 year old female, nonsmoker, patient of Dr. Lovell Sheehan is in today with complaints of sneezing, runny nose, cough and congestion than the one on for 4 days. She's been taking over-the-counter cold and cough medication that has helped some. She has become concerned because she feels chest congestion. She is scheduled for cataract surgery on Monday. Denies any fever, muscle aches or pain, shortness of breath or wheezing no chest pain or palpitations.   Review of Systems  Constitutional: Negative.   HENT: Positive for congestion, rhinorrhea and postnasal drip.   Eyes: Negative.   Respiratory: Positive for cough.   Cardiovascular: Negative.   Genitourinary: Negative.   Musculoskeletal: Negative.   Skin: Negative.   Hematological: Negative.   Psychiatric/Behavioral: Negative.        Past Medical History  Diagnosis Date  . Allergy   . Hyperlipidemia   . Arthritis     History   Social History  . Marital Status: Widowed    Spouse Name: N/A    Number of Children: N/A  . Years of Education: N/A   Occupational History  . retired    Social History Main Topics  . Smoking status: Never Smoker   . Smokeless tobacco: Not on file  . Alcohol Use: No  . Drug Use: No  . Sexually Active: No   Other Topics Concern  . Not on file   Social History Narrative  . No narrative on file    Past Surgical History  Procedure Date  . Appendectomy   . Exploratory laparotomy   . Tonsillectomy   . Foot fx      Family History  Problem Relation Age of Onset  . Heart attack    . Heart disease Mother   . Hypertension Mother   . Diabetes Daughter     No Known Allergies  Current Outpatient Prescriptions on File Prior to Visit  Medication Sig Dispense Refill  . calcium carbonate (OS-CAL) 600 MG TABS Take 600 mg by mouth 2 (two) times daily with a meal.        . ergocalciferol (VITAMIN  D2) 50000 UNITS capsule take 1 capsule by mouth every week  5 capsule  2  . fish oil-omega-3 fatty acids 1000 MG capsule Take 2 capsules (2 g total) by mouth daily.      . meloxicam (MOBIC) 15 MG tablet TAKE 1 TABLET BY MOUTH   EVERY DAY  30 tablet  4  . omeprazole (PRILOSEC) 20 MG capsule Take 1 capsule (20 mg total) by mouth daily.      . simvastatin (ZOCOR) 40 MG tablet Take 1 tablet (40 mg total) by mouth at bedtime.  90 tablet  0   No current facility-administered medications on file prior to visit.    BP 130/82  Temp(Src) 98.1 F (36.7 C) (Oral)  Ht 5\' 4"  (1.626 m)  Wt 170 lb (77.111 kg)  BMI 29.18 kg/m2chart Objective:   Physical Exam  Constitutional: She is oriented to person, place, and time. She appears well-developed and well-nourished.  HENT:  Right Ear: External ear normal.  Nose: Nose normal.  Mouth/Throat: Oropharynx is clear and moist.  Neck: Normal range of motion. Neck supple.  Cardiovascular: Normal rate, regular rhythm and normal heart sounds.   Pulmonary/Chest: Effort normal. She has no wheezes. She has no rales.  Neurological: She is alert and oriented to person,  place, and time.  Skin: Skin is warm and dry.  Psychiatric: She has a normal mood and affect.          Assessment & Plan:  Assessment: Upper respiratory infection, allergic rhinitis-uncontrolled  Plan: Depo-Medrol 40 mg IM x1. Over-the-counter Claritin or Zyrtec once daily. Rest. Drink plenty of fluids. Call the office if symptoms worsen or persist, recheck as scheduled and when necessary.

## 2011-05-12 NOTE — Patient Instructions (Signed)

## 2011-05-12 NOTE — Telephone Encounter (Signed)
Pt will come to see Padonda today.

## 2011-06-23 ENCOUNTER — Encounter: Payer: Self-pay | Admitting: Internal Medicine

## 2011-06-23 ENCOUNTER — Ambulatory Visit (INDEPENDENT_AMBULATORY_CARE_PROVIDER_SITE_OTHER): Payer: Medicare HMO | Admitting: Internal Medicine

## 2011-06-23 VITALS — BP 136/72 | HR 72 | Temp 98.2°F | Resp 16 | Ht 64.0 in | Wt 164.0 lb

## 2011-06-23 DIAGNOSIS — M1990 Unspecified osteoarthritis, unspecified site: Secondary | ICD-10-CM

## 2011-06-23 DIAGNOSIS — E785 Hyperlipidemia, unspecified: Secondary | ICD-10-CM

## 2011-06-23 DIAGNOSIS — J309 Allergic rhinitis, unspecified: Secondary | ICD-10-CM

## 2011-06-23 NOTE — Progress Notes (Signed)
Subjective:    Patient ID: Sherry Watts, female    DOB: October 27, 1921, 78 y.o.   MRN: [id number]  HPI The patient is an 78 year old female who presents for followup of osteoarthritis hyperlipidemia and GERD he is doing remarkably well her weight is stable her blood pressure is excellent she is taking her medicines as she should.  She recently underwent grief and is processed that well   Review of Systems  Constitutional: Negative for activity change, appetite change and fatigue.  HENT: Negative for ear pain, congestion, neck pain, postnasal drip and sinus pressure.   Eyes: Negative for redness and visual disturbance.  Respiratory: Negative for cough, shortness of breath and wheezing.   Gastrointestinal: Negative for abdominal pain and abdominal distention.  Genitourinary: Negative for dysuria, frequency and menstrual problem.  Musculoskeletal: Negative for myalgias, joint swelling and arthralgias.  Skin: Negative for rash and wound.  Neurological: Negative for dizziness, weakness and headaches.  Hematological: Negative for adenopathy. Does not bruise/bleed easily.  Psychiatric/Behavioral: Negative for sleep disturbance and decreased concentration.   Past Medical History  Diagnosis Date  . Allergy   . Hyperlipidemia   . Arthritis     History   Social History  . Marital Status: Widowed    Spouse Name: N/A    Number of Children: N/A  . Years of Education: N/A   Occupational History  . retired    Social History Main Topics  . Smoking status: Never Smoker   . Smokeless tobacco: Not on file  . Alcohol Use: No  . Drug Use: No  . Sexually Active: No   Other Topics Concern  . Not on file   Social History Narrative  . No narrative on file    Past Surgical History  Procedure Date  . Appendectomy   . Exploratory laparotomy   . Tonsillectomy   . Foot fx      Family History  Problem Relation Age of Onset  . Heart attack    . Heart disease Mother   . Hypertension  Mother   . Diabetes Daughter     No Known Allergies  Current Outpatient Prescriptions on File Prior to Visit  Medication Sig Dispense Refill  . calcium carbonate (OS-CAL) 600 MG TABS Take 600 mg by mouth 2 (two) times daily with a meal.        . ergocalciferol (VITAMIN D2) 50000 UNITS capsule take 1 capsule by mouth every week  5 capsule  2  . fish oil-omega-3 fatty acids 1000 MG capsule Take 2 capsules (2 g total) by mouth daily.      . meloxicam (MOBIC) 15 MG tablet TAKE 1 TABLET BY MOUTH   EVERY DAY  30 tablet  4  . omeprazole (PRILOSEC) 20 MG capsule Take 1 capsule (20 mg total) by mouth daily.      . simvastatin (ZOCOR) 40 MG tablet Take 1 tablet (40 mg total) by mouth at bedtime.  90 tablet  0    BP 136/72  Pulse 72  Temp 98.2 F (36.8 C)  Resp 16  Ht 5\' 4"  (1.626 m)  Wt 164 lb (74.39 kg)  BMI 28.15 kg/m2       Objective:   Physical Exam  Constitutional: She is oriented to person, place, and time. She appears well-developed and well-nourished. No distress.  HENT:  Head: Normocephalic and atraumatic.  Right Ear: External ear normal.  Left Ear: External ear normal.  Nose: Nose normal.  Mouth/Throat: Oropharynx is clear and moist.  Eyes: Conjunctivae and EOM are normal. Pupils are equal, round, and reactive to light.  Neck: Normal range of motion. Neck supple. No JVD present. No tracheal deviation present. No thyromegaly present.  Cardiovascular: Normal rate, regular rhythm, normal heart sounds and intact distal pulses.   No murmur heard. Pulmonary/Chest: Effort normal and breath sounds normal. She has no wheezes. She exhibits no tenderness.  Abdominal: Soft. Bowel sounds are normal.  Musculoskeletal: Normal range of motion. She exhibits no edema and no tenderness.  Lymphadenopathy:    She has no cervical adenopathy.  Neurological: She is alert and oriented to person, place, and time. She has normal reflexes. No cranial nerve deficit.  Skin: Skin is warm and dry. She  is not diaphoretic.  Psychiatric: She has a normal mood and affect. Her behavior is normal.          Assessment & Plan:  We encouraged her to continue doing what she is doing to continue to be active to take her medication as prescribed including the Zocor and the omega-3 as well as the calcium.  The key to her longevity is her movement and activity as well as her spirit and outlook

## 2011-06-23 NOTE — Patient Instructions (Addendum)
The patient is instructed to continue all medications as prescribed. Schedule followup with check out clerk upon leaving the clinic Back Exercises Back exercises help treat and prevent back injuries. The goal of back exercises is to increase the strength of your abdominal and back muscles and the flexibility of your back. These exercises should be started when you no longer have back pain. Back exercises include:  Pelvic Tilt. Lie on your back with your knees bent. Tilt your pelvis until the lower part of your back is against the floor. Hold this position 5 to 10 sec and repeat 5 to 10 times.   Knee to Chest. Pull first 1 knee up against your chest and hold for 20 to 30 seconds, repeat this with the other knee, and then both knees. This may be done with the other leg straight or bent, whichever feels better.   Sit-Ups or Curl-Ups. Bend your knees 90 degrees. Start with tilting your pelvis, and do a partial, slow sit-up, lifting your trunk only 30 to 45 degrees off the floor. Take at least 2 to 3 seconds for each sit-up. Do not do sit-ups with your knees out straight. If partial sit-ups are difficult, simply do the above but with only tightening your abdominal muscles and holding it as directed.   Hip-Lift. Lie on your back with your knees flexed 90 degrees. Push down with your feet and shoulders as you raise your hips a couple inches off the floor; hold for 10 seconds, repeat 5 to 10 times.   Back arches. Lie on your stomach, propping yourself up on bent elbows. Slowly press on your hands, causing an arch in your low back. Repeat 3 to 5 times. Any initial stiffness and discomfort should lessen with repetition over time.   Shoulder-Lifts. Lie face down with arms beside your body. Keep hips and torso pressed to floor as you slowly lift your head and shoulders off the floor.  Do not overdo your exercises, especially in the beginning. Exercises may cause you some mild back discomfort which lasts for a  few minutes; however, if the pain is more severe, or lasts for more than 15 minutes, do not continue exercises until you see your caregiver. Improvement with exercise therapy for back problems is slow.  See your caregivers for assistance with developing a proper back exercise program. Document Released: 04/13/2004 Document Revised: 02/23/2011 Document Reviewed: 03/06/2005 [employer] Patient Information 2012 [employer] 

## 2011-07-04 ENCOUNTER — Other Ambulatory Visit: Payer: Self-pay | Admitting: Internal Medicine

## 2011-07-17 ENCOUNTER — Other Ambulatory Visit: Payer: Self-pay | Admitting: Internal Medicine

## 2011-10-25 ENCOUNTER — Other Ambulatory Visit: Payer: Self-pay | Admitting: Internal Medicine

## 2011-12-08 ENCOUNTER — Ambulatory Visit (INDEPENDENT_AMBULATORY_CARE_PROVIDER_SITE_OTHER): Payer: Medicare HMO

## 2011-12-08 DIAGNOSIS — Z23 Encounter for immunization: Secondary | ICD-10-CM

## 2011-12-25 ENCOUNTER — Encounter: Payer: Medicare HMO | Admitting: Internal Medicine

## 2012-02-06 ENCOUNTER — Other Ambulatory Visit: Payer: Self-pay | Admitting: Internal Medicine

## 2012-02-23 ENCOUNTER — Encounter: Payer: Self-pay | Admitting: Internal Medicine

## 2012-02-23 ENCOUNTER — Ambulatory Visit (INDEPENDENT_AMBULATORY_CARE_PROVIDER_SITE_OTHER): Payer: Medicare HMO | Admitting: Internal Medicine

## 2012-02-23 VITALS — BP 136/72 | HR 72 | Temp 98.0°F | Resp 16 | Ht 64.0 in | Wt 174.0 lb

## 2012-02-23 DIAGNOSIS — E785 Hyperlipidemia, unspecified: Secondary | ICD-10-CM

## 2012-02-23 DIAGNOSIS — Z0000 Encounter for general adult medical examination without abnormal findings: Secondary | ICD-10-CM

## 2012-02-23 LAB — HEPATIC FUNCTION PANEL
ALT: 16 U/L (ref 0–35)
AST: 26 U/L (ref 0–37)
Total Protein: 7 g/dL (ref 6.0–8.3)

## 2012-02-23 LAB — CBC WITH DIFFERENTIAL/PLATELET
Basophils Absolute: 0 10*3/uL (ref 0.0–0.1)
Basophils Relative: 0.1 % (ref 0.0–3.0)
Eosinophils Absolute: 0.1 10*3/uL (ref 0.0–0.7)
HCT: 41.3 % (ref 36.0–46.0)
Hemoglobin: 13.1 g/dL (ref 12.0–15.0)
Lymphs Abs: 1.5 10*3/uL (ref 0.7–4.0)
MCHC: 31.8 g/dL (ref 30.0–36.0)
Monocytes Relative: 8.3 % (ref 3.0–12.0)
Neutro Abs: 4.1 10*3/uL (ref 1.4–7.7)
RBC: 4.28 Mil/uL (ref 3.87–5.11)
RDW: 14.4 % (ref 11.5–14.6)

## 2012-02-23 LAB — LIPID PANEL
Cholesterol: 167 mg/dL (ref 0–200)
Triglycerides: 127 mg/dL (ref 0.0–149.0)

## 2012-02-23 LAB — BASIC METABOLIC PANEL
BUN: 23 mg/dL (ref 6–23)
CO2: 25 mEq/L (ref 19–32)
Chloride: 109 mEq/L (ref 96–112)
Creatinine, Ser: 1.1 mg/dL (ref 0.4–1.2)
Glucose, Bld: 97 mg/dL (ref 70–99)
Potassium: 4.7 mEq/L (ref 3.5–5.1)

## 2012-02-23 LAB — POCT URINALYSIS DIPSTICK
Blood, UA: NEGATIVE
Glucose, UA: NEGATIVE
Spec Grav, UA: >=1.03
Urobilinogen, UA: 0.2

## 2012-02-23 LAB — HEMOGLOBIN A1C: Hgb A1c MFr Bld: 6.3 % (ref 4.6–6.5)

## 2012-02-23 NOTE — Patient Instructions (Addendum)
The patient is instructed to continue all medications as prescribed. Schedule followup with check out clerk upon leaving the clinic Weight loss needed and resume exercise

## 2012-02-23 NOTE — Progress Notes (Signed)
Subjective:    Patient ID: Sherry Watts, female    DOB: 1921-05-15, 78 y.o.   MRN: [id number]  HPI CPX Not exercising Not walking  Gradual weight gain and increased risks   Review of Systems  Constitutional: Negative for activity change, appetite change and fatigue.  HENT: Negative for ear pain, congestion, neck pain, postnasal drip and sinus pressure.   Eyes: Negative for redness and visual disturbance.  Respiratory: Negative for cough, shortness of breath and wheezing.   Gastrointestinal: Negative for abdominal pain and abdominal distention.  Genitourinary: Negative for dysuria, frequency and menstrual problem.  Musculoskeletal: Negative for myalgias, joint swelling and arthralgias.  Skin: Negative for rash and wound.  Neurological: Negative for dizziness, weakness and headaches.  Hematological: Negative for adenopathy. Does not bruise/bleed easily.  Psychiatric/Behavioral: Negative for sleep disturbance and decreased concentration.   Past Medical History  Diagnosis Date  . Allergy   . Hyperlipidemia   . Arthritis     History   Social History  . Marital Status: Widowed    Spouse Name: N/A    Number of Children: N/A  . Years of Education: N/A   Occupational History  . retired    Social History Main Topics  . Smoking status: Never Smoker   . Smokeless tobacco: Not on file  . Alcohol Use: No  . Drug Use: No  . Sexually Active: No   Other Topics Concern  . Not on file   Social History Narrative  . No narrative on file    Past Surgical History  Procedure Date  . Appendectomy   . Exploratory laparotomy   . Tonsillectomy   . Foot fx      Family History  Problem Relation Age of Onset  . Heart attack    . Heart disease Mother   . Hypertension Mother   . Diabetes Daughter     No Known Allergies  Current Outpatient Prescriptions on File Prior to Visit  Medication Sig Dispense Refill  . calcium carbonate (OS-CAL) 600 MG TABS Take 600 mg by mouth 2  (two) times daily with a meal.        . fish oil-omega-3 fatty acids 1000 MG capsule Take 2 capsules (2 g total) by mouth daily.      . meloxicam (MOBIC) 15 MG tablet TAKE 1 TABLET BY MOUTH   EVERY DAY  30 tablet  4  . omeprazole (PRILOSEC) 20 MG capsule Take 1 capsule (20 mg total) by mouth daily.      . simvastatin (ZOCOR) 40 MG tablet TAKE ONE TABLET BY MOUTH DAILY  90 tablet  3  . Vitamin D, Ergocalciferol, (DRISDOL) 50000 UNITS CAPS take 1 capsule by mouth every week  5 capsule  2  . [DISCONTINUED] simvastatin (ZOCOR) 40 MG tablet Take 1 tablet (40 mg total) by mouth at bedtime.  90 tablet  0    BP 136/72  Pulse 72  Temp 98 F (36.7 C)  Resp 16  Ht 5\' 4"  (1.626 m)  Wt 174 lb (78.926 kg)  BMI 29.87 kg/m2        Objective:   Physical Exam  Nursing note and vitals reviewed. Constitutional: She is oriented to person, place, and time. She appears well-developed and well-nourished. No distress.  HENT:  Head: Normocephalic and atraumatic.  Right Ear: External ear normal.  Left Ear: External ear normal.  Nose: Nose normal.  Mouth/Throat: Oropharynx is clear and moist.  Eyes: Conjunctivae normal and EOM are normal. Pupils are  equal, round, and reactive to light.  Neck: Normal range of motion. Neck supple. No JVD present. No tracheal deviation present. No thyromegaly present.  Cardiovascular: Normal rate, regular rhythm, normal heart sounds and intact distal pulses.   No murmur heard. Pulmonary/Chest: Effort normal and breath sounds normal. She has no wheezes. She exhibits no tenderness.  Abdominal: Soft. Bowel sounds are normal.  Musculoskeletal: Normal range of motion. She exhibits edema and tenderness.  Lymphadenopathy:    She has no cervical adenopathy.  Neurological: She is alert and oriented to person, place, and time. She has normal reflexes. No cranial nerve deficit.  Skin: Skin is warm and dry. She is not diaphoretic.  Psychiatric: She has a normal mood and affect. Her  behavior is normal.          Assessment & Plan:   This is a routine physical examination for this healthy  Female. Reviewed all health maintenance protocols including mammography colonoscopy bone density and reviewed appropriate screening labs. Her immunization history was reviewed as well as her current medications and allergies refills of her chronic medications were given and the plan for yearly health maintenance was discussed all orders and referrals were made as appropriate.

## 2012-04-22 ENCOUNTER — Encounter (HOSPITAL_COMMUNITY): Payer: Self-pay | Admitting: Emergency Medicine

## 2012-04-22 ENCOUNTER — Emergency Department (HOSPITAL_COMMUNITY)
Admission: EM | Admit: 2012-04-22 | Discharge: 2012-04-22 | Disposition: A | Discharge status: Home / Self Care | Payer: Medicare HMO | Source: Home / Self Care

## 2012-04-22 ENCOUNTER — Telehealth: Payer: Self-pay | Admitting: Internal Medicine

## 2012-04-22 DIAGNOSIS — R05 Cough: Secondary | ICD-10-CM

## 2012-04-22 DIAGNOSIS — R0982 Postnasal drip: Secondary | ICD-10-CM

## 2012-04-22 NOTE — ED Provider Notes (Signed)
History     CSN: [id number]  Arrival date & time 04/22/12  1648   None     Chief Complaint  Patient presents with  . URI    (Consider location/radiation/quality/duration/timing/severity/associated sxs/prior treatment) HPI Comments: This well-preserved, active, self-sufficient 79 year old female presents with a cough, runny nose and scratchy throat for 2-3 days. She denies pain in her throat, fever, chills, earache, problems breathing, GI or GU symptoms.   Past Medical History  Diagnosis Date  . Allergy   . Hyperlipidemia   . Arthritis     Past Surgical History  Procedure Date  . Appendectomy   . Exploratory laparotomy   . Tonsillectomy   . Foot fx      Family History  Problem Relation Age of Onset  . Heart attack    . Heart disease Mother   . Hypertension Mother   . Diabetes Daughter     History  Substance Use Topics  . Smoking status: Never Smoker   . Smokeless tobacco: Not on file  . Alcohol Use: No    OB History    Grav Para Term Preterm Abortions TAB SAB Ect Mult Living                  Review of Systems  Constitutional: Negative for fever, chills, activity change, appetite change and fatigue.  HENT: Positive for rhinorrhea and postnasal drip. Negative for congestion, sore throat, facial swelling, mouth sores, neck pain and neck stiffness.   Eyes: Negative.   Respiratory: Negative.   Cardiovascular: Negative.   Gastrointestinal: Negative.   Genitourinary: Negative.   Skin: Negative for pallor and rash.  Neurological: Negative.   Psychiatric/Behavioral: Negative.     Allergies  Review of patient's allergies indicates no known allergies.  Home Medications   Current Outpatient Rx  Name  Route  Sig  Dispense  Refill  . CALCIUM CARBONATE 600 MG PO TABS   Oral   Take 600 mg by mouth 2 (two) times daily with a meal.           . OMEGA-3 FATTY ACIDS 1000 MG PO CAPS   Oral   Take 2 capsules (2 g total) by mouth daily.         . MELOXICAM  15 MG PO TABS      TAKE 1 TABLET BY MOUTH   EVERY DAY   30 tablet   4   . OMEPRAZOLE 20 MG PO CPDR   Oral   Take 1 capsule (20 mg total) by mouth daily.         Marland Kitchen SIMVASTATIN 40 MG PO TABS      TAKE ONE TABLET BY MOUTH DAILY   90 tablet   3   . VITAMIN D (ERGOCALCIFEROL) 50000 UNITS PO CAPS      take 1 capsule by mouth every week   5 capsule   2     BP 120/75  Pulse 90  Temp 99.6 F (37.6 C) (Oral)  Resp 20  SpO2 98%  Physical Exam  Nursing note and vitals reviewed. Constitutional: She is oriented to person, place, and time. She appears well-developed and well-nourished. No distress.  HENT:  Mouth/Throat: No oropharyngeal exudate.       Bilateral TMs are normal Oropharynx with minimal erythema and clear PND .   Eyes: Conjunctivae normal and EOM are normal.  Neck: Normal range of motion. Neck supple.  Cardiovascular: Normal rate, regular rhythm and normal heart sounds.   Pulmonary/Chest: Effort normal  and breath sounds normal. No respiratory distress. She has no wheezes. She has no rales.  Abdominal: Soft. There is no tenderness.  Musculoskeletal: Normal range of motion. She exhibits no edema.  Lymphadenopathy:    She has no cervical adenopathy.  Neurological: She is alert and oriented to person, place, and time.  Skin: Skin is warm and dry. No rash noted.  Psychiatric: She has a normal mood and affect.    ED Course  Procedures (including critical care time)  Labs Reviewed - No data to display No results found.   1. PND (post-nasal drip)   2. Cough       MDM  Patient is discharged in good condition and stable to she is afebrile, smiling, jovial and in no distress whatsoever. The symptoms are coming from PND and I have recommended she take Claritin 10 mg one daily when necessary. If this is not strong enough she may take Chlor-Trimeton 4 mg, one half tablet every 4-6 hours when necessary drainage. Drink plenty of fluids stay well hydrated Followup  with your primary care doctor if you are not improving or developing new symptoms or problems.        Hayden Rasmussen, NP 04/22/12 1840

## 2012-04-22 NOTE — ED Notes (Signed)
Pt c/o cold sx x5 days Sx include: dry cough, rattling chest, chest/nasal congestion, runny nose Denies: f/n/d Taking OTC decongestant meds Eating/drinking fluids well  She is alert w/no signs of acute distress.

## 2012-04-22 NOTE — Telephone Encounter (Signed)
Patient Information:  Caller Name: Meztli  Phone: [phone]  Patient: Sherry Watts  Gender: Female  DOB: [birth year]  Age: 79 Years  PCP: Darryll Capers (Adults only)  Office Follow Up:  Does the office need to follow up with this patient?: No  Instructions For The Office: N/A  RN Note:  Cough has worsened today.  Mild wheezing at times.  No appts.  Pt agrees to go to UC.  Symptoms  Reason For Call & Symptoms: Cough and nasal congestion  Reviewed Health History In EMR: Yes  Reviewed Medications In EMR: Yes  Reviewed Allergies In EMR: Yes  Reviewed Surgeries / Procedures: Yes  Date of Onset of Symptoms: 04/18/2012  Treatments Tried: OTC cough medication  Treatments Tried Worked: No  Guideline(s) Used:  Cough  Disposition Per Guideline:   Go to Office Now  Reason For Disposition Reached:   Wheezing is present  Advice Given:  Call Back If:  Difficulty breathing  You become worse.

## 2012-04-23 NOTE — ED Provider Notes (Signed)
Medical screening examination/treatment/procedure(s) were performed by resident physician or non-physician practitioner and as supervising physician I was immediately available for consultation/collaboration.   Barkley Bruns MD.    Linna Hoff, MD 04/23/12 [phone]

## 2012-05-22 ENCOUNTER — Other Ambulatory Visit: Payer: Self-pay | Admitting: Internal Medicine

## 2012-06-19 ENCOUNTER — Other Ambulatory Visit: Payer: Self-pay | Admitting: Internal Medicine

## 2012-08-04 ENCOUNTER — Other Ambulatory Visit: Payer: Self-pay | Admitting: Internal Medicine

## 2012-08-22 ENCOUNTER — Other Ambulatory Visit: Payer: Self-pay | Admitting: Internal Medicine

## 2012-08-23 ENCOUNTER — Ambulatory Visit: Payer: Medicare HMO | Admitting: Internal Medicine

## 2012-08-28 ENCOUNTER — Ambulatory Visit (INDEPENDENT_AMBULATORY_CARE_PROVIDER_SITE_OTHER): Payer: Medicare HMO | Admitting: Family Medicine

## 2012-08-28 ENCOUNTER — Encounter: Payer: Self-pay | Admitting: Family Medicine

## 2012-08-28 VITALS — BP 150/62 | Temp 98.4°F

## 2012-08-28 DIAGNOSIS — J069 Acute upper respiratory infection, unspecified: Secondary | ICD-10-CM

## 2012-08-28 MED ORDER — HYDROCODONE-HOMATROPINE 5-1.5 MG/5ML PO SYRP: 5.0000 mL | ORAL_SOLUTION | Freq: Four times a day (QID) | ORAL | Status: AC | PRN

## 2012-08-28 NOTE — Progress Notes (Signed)
  Subjective:    Patient ID: Sherry Watts, female    DOB: 23-Sep-1921, 79 y.o.   MRN: [id number]  HPI Acute visit Onset of sore throat last Friday. Over the weekend had some laryngitis. She's had mild nasal congestion. Now has some dry cough. Denies any headaches. No nausea or vomiting. Denies any fever or chills. No significant body aches. Sore throat relieved with over-the-counter throat lozenges. Cough is mostly nonproductive and worse at night. Requesting cough medication  Stays very active with volunteer work. Nonsmoker  Past Medical History  Diagnosis Date  . Allergy   . Hyperlipidemia   . Arthritis    Past Surgical History  Procedure Laterality Date  . Appendectomy    . Exploratory laparotomy    . Tonsillectomy    . Foot fx       reports that she has never smoked. She does not have any smokeless tobacco history on file. She reports that she does not drink alcohol or use illicit drugs. family history includes Diabetes in her daughter; Heart attack in an unspecified family member; Heart disease in her mother; and Hypertension in her mother. No Known Allergies    Review of Systems  Constitutional: Positive for fatigue. Negative for fever and chills.  HENT: Positive for congestion, sore throat and voice change.   Respiratory: Positive for cough. Negative for wheezing.   Cardiovascular: Negative for chest pain.  Neurological: Negative for headaches.       Objective:   Physical Exam  Constitutional: She appears well-developed and well-nourished. No distress.  HENT:  Right Ear: External ear normal.  Left Ear: External ear normal.  Mouth/Throat: Oropharynx is clear and moist.  Neck: Neck supple.  Cardiovascular: Normal rate and regular rhythm.   Pulmonary/Chest: Effort normal and breath sounds normal. No respiratory distress. She has no wheezes. She has no rales.  Lymphadenopathy:    She has no cervical adenopathy.          Assessment & Plan:  Viral URI.  Reassurance. Hycodan cough syrup for nighttime use as needed. Followup promptly for any fever or worsening symptoms

## 2012-08-28 NOTE — Patient Instructions (Addendum)

## 2012-09-27 ENCOUNTER — Ambulatory Visit (INDEPENDENT_AMBULATORY_CARE_PROVIDER_SITE_OTHER): Payer: Medicare HMO | Admitting: Internal Medicine

## 2012-09-27 ENCOUNTER — Encounter: Payer: Self-pay | Admitting: Internal Medicine

## 2012-09-27 VITALS — BP 156/80 | HR 76 | Temp 98.2°F | Resp 16 | Ht 64.0 in | Wt 171.0 lb

## 2012-09-27 DIAGNOSIS — E785 Hyperlipidemia, unspecified: Secondary | ICD-10-CM

## 2012-09-27 DIAGNOSIS — R030 Elevated blood-pressure reading, without diagnosis of hypertension: Secondary | ICD-10-CM

## 2012-09-27 LAB — BASIC METABOLIC PANEL
Chloride: 111 mEq/L (ref 96–112)
GFR: 69.61 mL/min (ref >60.00)
Glucose, Bld: 92 mg/dL (ref 70–99)
Potassium: 4.7 mEq/L (ref 3.5–5.1)
Sodium: 143 mEq/L (ref 135–145)

## 2012-09-27 LAB — LIPID PANEL
HDL: 43.6 mg/dL (ref >39.00)
LDL Cholesterol: 123 mg/dL — ABNORMAL HIGH (ref 0–99)
VLDL: 27.6 mg/dL (ref 0.0–40.0)

## 2012-09-27 MED ORDER — BISOPROLOL-HYDROCHLOROTHIAZIDE 2.5-6.25 MG PO TABS: 1.0000 | ORAL_TABLET | Freq: Every day | ORAL | Status: DC

## 2012-09-27 NOTE — Progress Notes (Signed)
  Subjective:    Patient ID: Sherry Watts, female    DOB: Oct 25, 1921, 79 y.o.   MRN: [id number]  HPI [facility]pped [facility] due [facility] [employer] Has gained weight Has noted mild peripheral edema No SOB, No chest pain, No neuro symtoms Has not been on medications    Review of Systems  Constitutional: Negative for activity change, appetite change and fatigue.  HENT: Negative for ear pain, congestion, neck pain, postnasal drip and sinus pressure.   Eyes: Negative for redness and visual disturbance.  Respiratory: Negative for cough, shortness of breath and wheezing.   Cardiovascular: Positive for leg swelling.  Gastrointestinal: Negative for abdominal pain and abdominal distention.  Genitourinary: Negative for dysuria, frequency and menstrual problem.  Musculoskeletal: Negative for myalgias, joint swelling and arthralgias.  Skin: Negative for rash and wound.  Neurological: Negative for dizziness, weakness and headaches.  Hematological: Negative for adenopathy. Does not bruise/bleed easily.  Psychiatric/Behavioral: Negative for sleep disturbance and decreased concentration.       Objective:   Physical Exam  Nursing note and vitals reviewed. Constitutional: She is oriented to person, place, and time. She appears well-developed and well-nourished. No distress.  HENT:  Head: Normocephalic and atraumatic.  Eyes: Conjunctivae and EOM are normal. Pupils are equal, round, and reactive to light.  Neck: Normal range of motion. Neck supple. No JVD present. No tracheal deviation present. No thyromegaly present.  Cardiovascular: Normal rate and regular rhythm.   Murmur heard. Pulmonary/Chest: Effort normal and breath sounds normal. She has no wheezes. She exhibits no tenderness.  Abdominal: Soft. Bowel sounds are normal.  Musculoskeletal: Normal range of motion. She exhibits no edema and no tenderness.  Lymphadenopathy:    She has no cervical adenopathy.  Neurological: She is alert and  oriented to person, place, and time. She has normal reflexes. No cranial nerve deficit.  Skin: Skin is warm and dry. She is not diaphoretic.  Psychiatric: She has a normal mood and affect. Her behavior is normal.          Assessment & Plan:  Start low dose BB with ziac 2.5 and monitor blood pressure Lipid monitoring today with lipid panel ROV for HTN? In 3 months

## 2012-09-27 NOTE — Patient Instructions (Signed)
Start the ziac every morning

## 2012-11-27 ENCOUNTER — Other Ambulatory Visit: Payer: Self-pay | Admitting: Internal Medicine

## 2012-12-06 ENCOUNTER — Encounter: Payer: Self-pay | Admitting: Internal Medicine

## 2012-12-06 ENCOUNTER — Ambulatory Visit: Payer: Medicare HMO | Admitting: Internal Medicine

## 2012-12-06 ENCOUNTER — Ambulatory Visit (INDEPENDENT_AMBULATORY_CARE_PROVIDER_SITE_OTHER): Payer: Medicare HMO | Admitting: Internal Medicine

## 2012-12-06 VITALS — BP 136/80

## 2012-12-06 DIAGNOSIS — Z23 Encounter for immunization: Secondary | ICD-10-CM

## 2012-12-06 DIAGNOSIS — I10 Essential (primary) hypertension: Secondary | ICD-10-CM

## 2012-12-06 MED ORDER — SIMVASTATIN 40 MG PO TABS: ORAL_TABLET | ORAL | Status: DC

## 2012-12-06 NOTE — Progress Notes (Signed)
  Subjective:    Patient ID: Sherry Watts, female    DOB: May 24, 1921, 79 y.o.   MRN: [id number]  HPI She was started on new blood pressure medication of celiac 2.5 and presents today for blood pressure check for effectiveness she has not had any side effects blood pressure medicines well tolerated and she is now achieved her goals.  She also presents for a flu shot today   Review of Systems     Objective:   Physical Exam  Stable blood pressure      Assessment & Plan:

## 2013-01-22 ENCOUNTER — Encounter: Payer: Medicare HMO | Admitting: Internal Medicine

## 2013-01-22 ENCOUNTER — Ambulatory Visit (INDEPENDENT_AMBULATORY_CARE_PROVIDER_SITE_OTHER): Payer: Medicare HMO | Admitting: Internal Medicine

## 2013-01-22 ENCOUNTER — Encounter: Payer: Self-pay | Admitting: Internal Medicine

## 2013-01-22 VITALS — BP 130/76 | HR 64 | Temp 98.2°F | Resp 16 | Ht 64.0 in | Wt 165.0 lb

## 2013-01-22 DIAGNOSIS — M1990 Unspecified osteoarthritis, unspecified site: Secondary | ICD-10-CM

## 2013-01-22 DIAGNOSIS — I10 Essential (primary) hypertension: Secondary | ICD-10-CM

## 2013-01-22 DIAGNOSIS — Z0000 Encounter for general adult medical examination without abnormal findings: Secondary | ICD-10-CM

## 2013-01-22 DIAGNOSIS — E785 Hyperlipidemia, unspecified: Secondary | ICD-10-CM

## 2013-01-22 LAB — HEPATIC FUNCTION PANEL
Albumin: 3.9 g/dL (ref 3.5–5.2)
Alkaline Phosphatase: 48 U/L (ref 39–117)

## 2013-01-22 LAB — BASIC METABOLIC PANEL
BUN: 23 mg/dL (ref 6–23)
Calcium: 9.2 mg/dL (ref 8.4–10.5)
GFR: 62.11 mL/min (ref >60.00)
Glucose, Bld: 90 mg/dL (ref 70–99)
Sodium: 140 mEq/L (ref 135–145)

## 2013-01-22 LAB — CBC WITH DIFFERENTIAL/PLATELET
Eosinophils Relative: 1.9 % (ref 0.0–5.0)
HCT: 38.7 % (ref 36.0–46.0)
Hemoglobin: 13 g/dL (ref 12.0–15.0)
Lymphs Abs: 2.1 10*3/uL (ref 0.7–4.0)
Monocytes Relative: 6.3 % (ref 3.0–12.0)
Neutro Abs: 4 10*3/uL (ref 1.4–7.7)
WBC: 6.7 10*3/uL (ref 4.5–10.5)

## 2013-01-22 LAB — LIPID PANEL
Cholesterol: 175 mg/dL (ref 0–200)
HDL: 42.7 mg/dL (ref >39.00)
Total CHOL/HDL Ratio: 4

## 2013-01-22 NOTE — Progress Notes (Signed)
Pre-visit discussion using our clinic review tool. No additional management support is needed unless otherwise documented below in the visit note.  

## 2013-01-22 NOTE — Progress Notes (Signed)
Subjective:    Patient ID: Sherry Watts, female    DOB: 09-20-1921, 79 y.o.   MRN: [id number]  HPI Increased tendonitis in forearm on the right Swelling and pain in the right knee HTn stable  Monitoring of lipids due    Review of Systems  Constitutional: Negative for activity change, appetite change and fatigue.  HENT: Negative for congestion, ear pain, postnasal drip and sinus pressure.   Eyes: Negative for redness and visual disturbance.  Respiratory: Negative for cough, shortness of breath and wheezing.   Gastrointestinal: Negative for abdominal pain and abdominal distention.  Genitourinary: Negative for dysuria, frequency and menstrual problem.  Musculoskeletal: Negative for arthralgias, joint swelling, myalgias and neck pain.  Skin: Negative for rash and wound.  Neurological: Negative for dizziness, weakness and headaches.  Hematological: Negative for adenopathy. Does not bruise/bleed easily.  Psychiatric/Behavioral: Negative for sleep disturbance and decreased concentration.   Past Medical History  Diagnosis Date  . Allergy   . Hyperlipidemia   . Arthritis     History   Social History  . Marital Status: Widowed    Spouse Name: N/A    Number of Children: N/A  . Years of Education: N/A   Occupational History  . retired    Social History Main Topics  . Smoking status: Never Smoker   . Smokeless tobacco: Not on file  . Alcohol Use: No  . Drug Use: No  . Sexual Activity: No   Other Topics Concern  . Not on file   Social History Narrative  . No narrative on file    Past Surgical History  Procedure Laterality Date  . Appendectomy    . Exploratory laparotomy    . Tonsillectomy    . Foot fx       Family History  Problem Relation Age of Onset  . Heart attack    . Heart disease Mother   . Hypertension Mother   . Diabetes Daughter     No Known Allergies  Current Outpatient Prescriptions on File Prior to Visit  Medication Sig Dispense Refill  .  bisoprolol-hydrochlorothiazide (ZIAC) 2.5-6.25 MG per tablet Take 1 tablet by mouth daily.  30 tablet  6  . calcium carbonate (OS-CAL) 600 MG TABS Take 600 mg by mouth 2 (two) times daily with a meal.        . fish oil-omega-3 fatty acids 1000 MG capsule Take 2 capsules (2 g total) by mouth daily.      . meloxicam (MOBIC) 15 MG tablet TAKE 1 TABLET BY MOUTH   EVERY DAY  30 tablet  4  . simvastatin (ZOCOR) 40 MG tablet TAKE ONE TABLET BY MOUTH DAILY  90 tablet  2  . Vitamin D, Ergocalciferol, (DRISDOL) 50000 UNITS CAPS capsule take 1 capsule by mouth every week  5 capsule  2  . omeprazole (PRILOSEC) 20 MG capsule Take 1 capsule (20 mg total) by mouth daily.       No current facility-administered medications on file prior to visit.    BP 130/76  Pulse 64  Temp(Src) 98.2 F (36.8 C)  Resp 16  Ht 5\' 4"  (1.626 m)  Wt 165 lb (74.844 kg)  BMI 28.31 kg/m2       Objective:   Physical Exam  Constitutional: She is oriented to person, place, and time. She appears well-developed and well-nourished. No distress.  HENT:  Head: Normocephalic and atraumatic.  Right Ear: External ear normal.  Left Ear: External ear normal.  Nose: Nose normal.  Mouth/Throat: Oropharynx is clear and moist.  Eyes: Conjunctivae and EOM are normal. Pupils are equal, round, and reactive to light.  Neck: Normal range of motion. Neck supple. No JVD present. No tracheal deviation present. No thyromegaly present.  Cardiovascular: Normal rate, regular rhythm, normal heart sounds and intact distal pulses.   No murmur heard. Pulmonary/Chest: Effort normal and breath sounds normal. She has no wheezes. She exhibits no tenderness.  Abdominal: Soft. Bowel sounds are normal.  Musculoskeletal: Normal range of motion. She exhibits no edema and no tenderness.  Lymphadenopathy:    She has no cervical adenopathy.  Neurological: She is alert and oriented to person, place, and time. She has normal reflexes. No cranial nerve deficit.   Skin: Skin is warm and dry. She is not diaphoretic.  Psychiatric: She has a normal mood and affect. Her behavior is normal.          Assessment & Plan:  Values will be ordered today to monitor her control of her lipids to monitor her liver functions for potential drug side effects and to monitor a basic metabolic panel for monitoring of her hypertensive medications and control of her hypertension and that might affect both potassium and her renal function.  Her thyroid panel should also be looked at due to her history of hyperlipidemia and do to her arthritis and need to take nonsteroidals a complete blood count will be repeated.  Blood pressure has improved control Subjective:    Sherry PUCILLO is a 79 y.o. female who presents for Medicare Annual/Subsequent preventive examination.  Preventive Screening-Counseling & Management  Tobacco History  Smoking status  . Never Smoker   Smokeless tobacco  . Not on file     Problems Prior to Visit 1.   Current Problems (verified) Patient Active Problem List   Diagnosis Date Noted  . HTN (hypertension) 12/06/2012  . OSTEOPOROSIS 05/25/2008  . LOC OSTEOARTHROS NOT SPEC PRIM/SEC LOWER LEG 09/23/2007  . OSTEOARTHRITIS 01/07/2007  . ADVEF, DRUG/MEDICINAL/BIOLOGICAL SUBST NOS 01/07/2007  . HYPERLIPIDEMIA 11/12/2006  . ALLERGIC RHINITIS 11/12/2006    Medications Prior to Visit Current Outpatient Prescriptions on File Prior to Visit  Medication Sig Dispense Refill  . bisoprolol-hydrochlorothiazide (ZIAC) 2.5-6.25 MG per tablet Take 1 tablet by mouth daily.  30 tablet  6  . calcium carbonate (OS-CAL) 600 MG TABS Take 600 mg by mouth 2 (two) times daily with a meal.        . fish oil-omega-3 fatty acids 1000 MG capsule Take 2 capsules (2 g total) by mouth daily.      . meloxicam (MOBIC) 15 MG tablet TAKE 1 TABLET BY MOUTH   EVERY DAY  30 tablet  4  . simvastatin (ZOCOR) 40 MG tablet TAKE ONE TABLET BY MOUTH DAILY  90 tablet  2  .  Vitamin D, Ergocalciferol, (DRISDOL) 50000 UNITS CAPS capsule take 1 capsule by mouth every week  5 capsule  2  . omeprazole (PRILOSEC) 20 MG capsule Take 1 capsule (20 mg total) by mouth daily.       No current facility-administered medications on file prior to visit.    Current Medications (verified) Current Outpatient Prescriptions  Medication Sig Dispense Refill  . bisoprolol-hydrochlorothiazide (ZIAC) 2.5-6.25 MG per tablet Take 1 tablet by mouth daily.  30 tablet  6  . calcium carbonate (OS-CAL) 600 MG TABS Take 600 mg by mouth 2 (two) times daily with a meal.        . fish oil-omega-3 fatty acids 1000 MG  capsule Take 2 capsules (2 g total) by mouth daily.      . meloxicam (MOBIC) 15 MG tablet TAKE 1 TABLET BY MOUTH   EVERY DAY  30 tablet  4  . simvastatin (ZOCOR) 40 MG tablet TAKE ONE TABLET BY MOUTH DAILY  90 tablet  2  . Vitamin D, Ergocalciferol, (DRISDOL) 50000 UNITS CAPS capsule take 1 capsule by mouth every week  5 capsule  2  . omeprazole (PRILOSEC) 20 MG capsule Take 1 capsule (20 mg total) by mouth daily.       No current facility-administered medications for this visit.     Allergies (verified) Review of patient's allergies indicates no known allergies.   PAST HISTORY  Family History Family History  Problem Relation Age of Onset  . Heart attack    . Heart disease Mother   . Hypertension Mother   . Diabetes Daughter     Social History History  Substance Use Topics  . Smoking status: Never Smoker   . Smokeless tobacco: Not on file  . Alcohol Use: No     Are there smokers in your home (other than you)? No  Risk Factors Current exercise habits: The patient does not participate in regular exercise at present.  Dietary issues discussed: low sodium   Cardiac risk factors: dyslipidemia, hypertension and sedentary lifestyle.  Depression Screen (Note: if answer to either of the following is "Yes", a more complete depression screening is indicated)   Over the  past two weeks, have you felt down, depressed or hopeless? No  Over the past two weeks, have you felt little interest or pleasure in doing things? No  Have you lost interest or pleasure in daily life? No  Do you often feel hopeless? No  Do you cry easily over simple problems? No  Activities of Daily Living In your present state of health, do you have any difficulty performing the following activities?:  Driving? No Managing money?  No Feeding yourself? No Getting from bed to chair? No Climbing a flight of stairs? No Preparing food and eating?: No Bathing or showering? No Getting dressed: No Getting to the toilet? No Using the toilet:No Moving around from place to place: No In the past year have you fallen or had a near fall?:No   Are you sexually active?  No  Do you have more than one partner?  No  Hearing Difficulties: No Do you often ask people to speak up or repeat themselves? No Do you experience ringing or noises in your ears? No Do you have difficulty understanding soft or whispered voices? No   Do you feel that you have a problem with memory? No  Do you often misplace items? No  Do you feel safe at home?  No  Cognitive Testing  Alert? Yes  Normal Appearance?Yes  Oriented to person? Yes  Place? Yes   Time? Yes  Recall of three objects?  Yes  Can perform simple calculations? Yes  Displays appropriate judgment?Yes  Can read the correct time from a watch face?Yes   Advanced Directives have been discussed with the patient? Yes  List the Names of Other Physician/Practitioners you currently use: 1.    Indicate any recent Medical Services you may have received from other than [facility] providers in the past year (date may be approximate).  Immunization History  Administered Date(s) Administered  . Influenza Split 12/21/2010, 12/08/2011  . Influenza Whole 03/20/2005, 01/08/2008, 02/03/2009, 01/24/2010  . Influenza,inj,Quad PF,36+ Mos 12/06/2012  . Pneumococcal  Polysaccharide 03/20/2005  . Td 03/20/2005    Screening Tests Health Maintenance  Topic Date Due  . Colonoscopy  03/01/1974  . Zostavax  03/01/1984  . Influenza Vaccine  10/18/2013  . Tetanus/tdap  03/21/2015  . Pneumococcal Polysaccharide Vaccine Age 27 And Over  Completed    All answers were reviewed with the patient and necessary referrals were made:  Carrie Mew, MD   01/22/2013   History reviewed: allergies, current medications, past family history, past medical history, past social history, past surgical history and problem list  Review of Systems Pertinent items are noted in HPI.    Objective:     Vision by Snellen chart: right eye:20/30, left eye:20/30  Body mass index is 28.31 kg/(m^2). BP 130/76  Pulse 64  Temp(Src) 98.2 F (36.8 C)  Resp 16  Ht 5\' 4"  (1.626 m)  Wt 165 lb (74.844 kg)  BMI 28.31 kg/m2  Exam per problem focused exam     Assessment:      Patient presents for yearly preventative medicine examination.    This is a routine physical examination for this healthy  Female. Reviewed all health maintenance protocols including mammography colonoscopy bone density and reviewed appropriate screening labs. Her immunization history was reviewed as well as her current medications and allergies refills of her chronic medications were given and the plan for yearly health maintenance was discussed all orders and referrals were made as appropriate.      Plan:     During the course of the visit the patient was educated and counseled about appropriate screening and preventive services including:    Influenza vaccine  Screening mammography  Colorectal cancer screening  Diet review for nutrition referral? Yes ____  Not Indicated ____   Patient Instructions (the written plan) was given to the patient.  Medicare Attestation I have personally reviewed: The patient's medical and social history Their use of alcohol, tobacco or illicit  drugs Their current medications and supplements The patient's functional ability including ADLs,fall risks, home safety risks, cognitive, and hearing and visual impairment Diet and physical activities Evidence for depression or mood disorders  The patient's weight, height, BMI, and visual acuity have been recorded in the chart.  I have made referrals, counseling, and provided education to the patient based on review of the above and I have provided the patient with a written personalized care plan for preventive services.     Carrie Mew, MD   01/22/2013

## 2013-03-19 ENCOUNTER — Other Ambulatory Visit: Payer: Self-pay | Admitting: Internal Medicine

## 2013-05-11 ENCOUNTER — Other Ambulatory Visit: Payer: Self-pay | Admitting: Internal Medicine

## 2013-06-30 ENCOUNTER — Other Ambulatory Visit: Payer: Self-pay | Admitting: Internal Medicine

## 2013-07-23 ENCOUNTER — Ambulatory Visit: Payer: Medicare HMO | Admitting: Internal Medicine

## 2013-08-22 ENCOUNTER — Encounter: Payer: Self-pay | Admitting: Internal Medicine

## 2013-08-22 ENCOUNTER — Ambulatory Visit (INDEPENDENT_AMBULATORY_CARE_PROVIDER_SITE_OTHER): Payer: Commercial Managed Care - HMO | Admitting: Internal Medicine

## 2013-08-22 VITALS — BP 128/77 | HR 64 | Temp 98.4°F | Ht 64.25 in | Wt 170.0 lb

## 2013-08-22 DIAGNOSIS — M810 Age-related osteoporosis without current pathological fracture: Secondary | ICD-10-CM

## 2013-08-22 DIAGNOSIS — I10 Essential (primary) hypertension: Secondary | ICD-10-CM

## 2013-08-22 DIAGNOSIS — M1710 Unilateral primary osteoarthritis, unspecified knee: Secondary | ICD-10-CM

## 2013-08-22 DIAGNOSIS — T887XXA Unspecified adverse effect of drug or medicament, initial encounter: Secondary | ICD-10-CM

## 2013-08-22 NOTE — Patient Instructions (Addendum)
/\   The patient is instructed to continue all medications as prescribed. Schedule followup with check out clerk upon leaving the clinic  Dr Eulis Foster.

## 2013-08-22 NOTE — Progress Notes (Signed)
Subjective:    Patient ID: Sherry Watts, female    DOB: October 17, 1921, 80 y.o.   MRN: [id number]  Hypertension Associated symptoms include headaches. Pertinent negatives include no neck pain or shortness of breath.  Hyperlipidemia Pertinent negatives include no myalgias or shortness of breath.   follow up for HTN   Review of Systems  Constitutional: Positive for fatigue. Negative for activity change and appetite change.  HENT: Negative for congestion, ear pain, postnasal drip and sinus pressure.   Eyes: Negative for redness and visual disturbance.  Respiratory: Negative for cough, shortness of breath and wheezing.   Gastrointestinal: Negative for abdominal pain and abdominal distention.  Genitourinary: Negative for dysuria, frequency and menstrual problem.  Musculoskeletal: Negative for arthralgias, joint swelling, myalgias and neck pain.  Skin: Negative for rash and wound.  Neurological: Positive for headaches. Negative for dizziness and weakness.  Hematological: Negative for adenopathy. Does not bruise/bleed easily.  Psychiatric/Behavioral: Negative for sleep disturbance and decreased concentration.   Stable no chest pain or SOB    Past Medical History  Diagnosis Date  . Allergy   . Hyperlipidemia   . Arthritis     History   Social History  . Marital Status: Widowed    Spouse Name: N/A    Number of Children: N/A  . Years of Education: N/A   Occupational History  . retired    Social History Main Topics  . Smoking status: Never Smoker   . Smokeless tobacco: Not on file  . Alcohol Use: No  . Drug Use: No  . Sexual Activity: No   Other Topics Concern  . Not on file   Social History Narrative  . No narrative on file    Past Surgical History  Procedure Laterality Date  . Appendectomy    . Exploratory laparotomy    . Tonsillectomy    . Foot fx       Family History  Problem Relation Age of Onset  . Heart attack    . Heart disease Mother   .  Hypertension Mother   . Diabetes Daughter     No Known Allergies  Current Outpatient Prescriptions on File Prior to Visit  Medication Sig Dispense Refill  . bisoprolol-hydrochlorothiazide (ZIAC) 2.5-6.25 MG per tablet take 1 tablet by mouth once daily  30 tablet  11  . calcium carbonate (OS-CAL) 600 MG TABS Take 600 mg by mouth 2 (two) times daily with a meal.        . fish oil-omega-3 fatty acids 1000 MG capsule Take 2 capsules (2 g total) by mouth daily.      . meloxicam (MOBIC) 15 MG tablet TAKE 1 TABLET BY MOUTH   EVERY DAY  30 tablet  4  . simvastatin (ZOCOR) 40 MG tablet TAKE ONE TABLET BY MOUTH DAILY  90 tablet  2  . Vitamin D, Ergocalciferol, (DRISDOL) 50000 UNITS CAPS capsule take 1 capsule by mouth every week  5 capsule  2  . omeprazole (PRILOSEC) 20 MG capsule Take 1 capsule (20 mg total) by mouth daily.       No current facility-administered medications on file prior to visit.    BP 140/78  Pulse 64  Temp(Src) 98.4 F (36.9 C) (Oral)  Ht 5' 4.25" (1.632 m)  Wt 170 lb (77.111 kg)  BMI 28.95 kg/m2    Objective:   Physical Exam  Constitutional: She appears well-nourished.  HENT:  Head: Normocephalic and atraumatic.  Cardiovascular: Normal rate.   Murmur heard. Pulmonary/Chest:  Effort normal and breath sounds normal.  Abdominal: Bowel sounds are normal.  Skin: Skin is warm. She is diaphoretic.          Assessment & Plan:  HTN stable  Repeat blood pressure good Still active and exercises daily Medication management and has been keeping adherent  Not renewing licence and will need to be seen at [facility] yearly ony   Bone density due at [facility]

## 2013-08-22 NOTE — Progress Notes (Signed)
Pre visit review using our clinic review tool, if applicable. No additional management support is needed unless otherwise documented below in the visit note. 

## 2013-08-25 ENCOUNTER — Telehealth: Payer: Self-pay | Admitting: Internal Medicine

## 2013-08-25 NOTE — Telephone Encounter (Signed)
Relevant patient education mailed to patient.  

## 2013-09-23 ENCOUNTER — Other Ambulatory Visit: Payer: Self-pay | Admitting: Internal Medicine

## 2013-11-11 ENCOUNTER — Other Ambulatory Visit: Payer: Self-pay | Admitting: Internal Medicine

## 2014-01-05 ENCOUNTER — Ambulatory Visit (INDEPENDENT_AMBULATORY_CARE_PROVIDER_SITE_OTHER): Payer: Commercial Managed Care - HMO | Admitting: Internal Medicine

## 2014-01-05 ENCOUNTER — Encounter: Payer: Self-pay | Admitting: Internal Medicine

## 2014-01-05 ENCOUNTER — Telehealth: Payer: Self-pay | Admitting: Internal Medicine

## 2014-01-05 VITALS — BP 118/72 | HR 58 | Temp 97.9°F | Resp 14 | Ht 64.0 in | Wt 168.0 lb

## 2014-01-05 DIAGNOSIS — M810 Age-related osteoporosis without current pathological fracture: Secondary | ICD-10-CM

## 2014-01-05 DIAGNOSIS — E785 Hyperlipidemia, unspecified: Secondary | ICD-10-CM

## 2014-01-05 DIAGNOSIS — Z0000 Encounter for general adult medical examination without abnormal findings: Secondary | ICD-10-CM

## 2014-01-05 DIAGNOSIS — Z23 Encounter for immunization: Secondary | ICD-10-CM

## 2014-01-05 DIAGNOSIS — I10 Essential (primary) hypertension: Secondary | ICD-10-CM

## 2014-01-05 DIAGNOSIS — M1711 Unilateral primary osteoarthritis, right knee: Secondary | ICD-10-CM

## 2014-01-05 NOTE — Telephone Encounter (Signed)
Patient would to know is it time for her Bone Density test?  Please advise

## 2014-01-05 NOTE — Assessment & Plan Note (Signed)
Will resolve this problem, most recent bone density 2012 with normal bone density. We'll continue with vitamin D supplementation.

## 2014-01-05 NOTE — Progress Notes (Signed)
   Subjective:    Patient ID: Sherry Watts, female    DOB: 1921/08/04, 80 y.o.   MRN: [id number]  HPI The patient is an 80 YO female who is coming in to establish care. She has PMH of HTN, hyperlipidemia. She denies any new complaints at this time. Her right knee does pain her from time to time and she has mobic prn which she uses infrequently. She denies anginal pains, SOB, abdominal pain. She had a cold in September which is gone now except for some mild dripping at the back of her throat. Overall she rates her health as pretty good.  Review of Systems  Constitutional: Negative for fever, activity change, appetite change and unexpected weight change.  HENT: Positive for postnasal drip.   Respiratory: Negative for cough, chest tightness, shortness of breath and wheezing.   Cardiovascular: Negative for chest pain, palpitations and leg swelling.  Gastrointestinal: Negative for abdominal pain, diarrhea, constipation and abdominal distention.  Genitourinary: Negative.   Musculoskeletal: Positive for arthralgias. Negative for back pain, gait problem and myalgias.  Skin: Negative.   Neurological: Negative for dizziness, weakness, light-headedness and headaches.  Psychiatric/Behavioral: Negative.       Objective:   Physical Exam  Vitals reviewed. Constitutional: She appears well-developed and well-nourished. No distress.  HENT:  Head: Normocephalic and atraumatic.  Nose: Nose normal.  Mouth/Throat: Oropharynx is clear and moist.  Eyes: EOM are normal.  Neck: Normal range of motion.  Cardiovascular: Normal rate and regular rhythm.   No murmur heard. Pulmonary/Chest: Effort normal and breath sounds normal. No respiratory distress. She has no wheezes. She has no rales.  Abdominal: Soft. Bowel sounds are normal.  Neurological: She is alert. Coordination normal.  Skin: Skin is warm and dry.   Filed Vitals:   01/05/14 0822  BP: 118/72  Pulse: 58  Temp: 97.9 F (36.6 C)  TempSrc: Oral    Resp: 14  Height: 5\' 4"  (1.626 m)  Weight: 168 lb (76.204 kg)  SpO2: 93%      Assessment & Plan:  Flu shot given today.

## 2014-01-05 NOTE — Telephone Encounter (Signed)
Her last was normal and I would not recommend for 5 years so 2017.

## 2014-01-05 NOTE — Progress Notes (Signed)
Pre visit review using our clinic review tool, if applicable. No additional management support is needed unless otherwise documented below in the visit note. 

## 2014-01-05 NOTE — Telephone Encounter (Signed)
Notified pt with md response.../lmb 

## 2014-01-05 NOTE — Assessment & Plan Note (Signed)
Patient uses Mobic when necessary for arthritis pain.

## 2014-01-05 NOTE — Assessment & Plan Note (Signed)
Per patient and her cholesterol used to be much higher. Advised to continue Zocor, stop fish oil. Last lipid panel one year ago reviewed and normal. We'll recheck in 4 years.

## 2014-01-05 NOTE — Patient Instructions (Signed)
We will see you back in about 6-12 months for a check up. If you are having any problems call us sooner.  Work on getting back to the gym to exercise a couple days a week to keep yourself healthy.

## 2014-01-05 NOTE — Assessment & Plan Note (Signed)
Continue bisoprolol-hydrochlorothiazide. Likely she could be controlled with single lesion however doing well on current regimen so we'll continue. No change in dose since last visit metabolic panel reviewed, normal.

## 2014-01-05 NOTE — Telephone Encounter (Signed)
Per chart last bone density done back 2012....Johny Chess

## 2014-01-05 NOTE — Assessment & Plan Note (Signed)
A flu shot given today, we'll look and records to see she is has colon cancer screening in the past however would not pursue at this time. She cannot recall if she ever had the shingles shot.

## 2014-04-21 ENCOUNTER — Other Ambulatory Visit: Payer: Self-pay

## 2014-04-21 ENCOUNTER — Telehealth: Payer: Self-pay

## 2014-04-21 DIAGNOSIS — Z0000 Encounter for general adult medical examination without abnormal findings: Secondary | ICD-10-CM

## 2014-04-21 NOTE — Telephone Encounter (Signed)
Done

## 2014-04-21 NOTE — Telephone Encounter (Signed)
Pt is going for her annual eye exam at [facility] on 2.3.2016 and the pt has [employer].  A referral needs to be done please.

## 2014-04-22 DIAGNOSIS — Z961 Presence of intraocular lens: Secondary | ICD-10-CM | POA: Diagnosis not present

## 2014-04-22 DIAGNOSIS — D3132 Benign neoplasm of left choroid: Secondary | ICD-10-CM | POA: Diagnosis not present

## 2014-04-22 DIAGNOSIS — H26493 Other secondary cataract, bilateral: Secondary | ICD-10-CM | POA: Diagnosis not present

## 2014-05-11 ENCOUNTER — Other Ambulatory Visit: Payer: Self-pay | Admitting: Geriatric Medicine

## 2014-05-11 MED ORDER — VITAMIN D (ERGOCALCIFEROL) 1.25 MG (50000 UNIT) PO CAPS: 50000.0000 [IU] | ORAL_CAPSULE | ORAL | Status: DC

## 2014-05-25 ENCOUNTER — Encounter: Payer: Self-pay | Admitting: Internal Medicine

## 2014-05-25 ENCOUNTER — Ambulatory Visit (INDEPENDENT_AMBULATORY_CARE_PROVIDER_SITE_OTHER): Payer: Medicare HMO | Admitting: Internal Medicine

## 2014-05-25 VITALS — BP 148/64 | HR 87 | Temp 97.5°F | Resp 18 | Wt 160.0 lb

## 2014-05-25 DIAGNOSIS — M170 Bilateral primary osteoarthritis of knee: Secondary | ICD-10-CM | POA: Diagnosis not present

## 2014-05-25 DIAGNOSIS — W19XXXA Unspecified fall, initial encounter: Secondary | ICD-10-CM

## 2014-05-25 NOTE — Assessment & Plan Note (Signed)
Can continue to use meloxicam as needed for pain. Strongly encouraged her to use cane for extra stability (in her left hand).

## 2014-05-25 NOTE — Assessment & Plan Note (Signed)
Suspect some instability from her severe osteoarthritis in the left knee and after injury Friday. Encouraged her to use heating pad or tylenol on her side. Advised to get cane to use around the house for stability. No head injury or LOC and no need for imaging at this time.

## 2014-05-25 NOTE — Progress Notes (Signed)
Pre visit review using our clinic review tool, if applicable. No additional management support is needed unless otherwise documented below in the visit note. 

## 2014-05-25 NOTE — Progress Notes (Signed)
   Subjective:    Patient ID: Sherry Watts, female    DOB: 1921/06/29, 81 y.o.   MRN: [id number]  HPI The patient is a 81 YO woman who is coming in today for a fall she had yesterday. 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 She was sitting for an extended period of time and then felt that her knee has been sore since that time. She was in the bathroom last night and after washing her hands went to leave the bathroom and fell forward. She denies LOC or hitting her head. Denies headache, nausea. She is having some focal pain on the left ribcage. She denies any SOB or pain with deep breathing. It is about 2/10 in pain today and she has not taken anything for it. She denies dizziness or lightheadedness with standing. She has a walker at home that she has been using some this weekend but not when she fell.   Review of Systems  Constitutional: Negative for fever, activity change, appetite change, fatigue and unexpected weight change.  Respiratory: Negative.   Cardiovascular: Negative.   Gastrointestinal: Negative.   Musculoskeletal: Positive for myalgias, arthralgias and gait problem.  Skin: Negative.   Neurological: Negative for dizziness, syncope, weakness, light-headedness and numbness.  Psychiatric/Behavioral: Negative.       Objective:   Physical Exam  Constitutional: She appears well-developed and well-nourished. No distress.  HENT:  Head: Normocephalic and atraumatic.  Eyes: EOM are normal. Pupils are equal, round, and reactive to light.  Neck: Normal range of motion.  Cardiovascular: Normal rate and regular rhythm.   No murmur heard. Pulmonary/Chest: Effort normal and breath sounds normal. No respiratory distress. She has no wheezes. She has no rales.  Abdominal: Soft. Bowel sounds are normal.  Musculoskeletal: She exhibits tenderness.  Left side along the ribcage mildly tender to touch, no pain with deep breathing or twisting.   Neurological: She is alert.  Coordination normal.  Skin: Skin is warm and dry.  Vitals reviewed.  Filed Vitals:   05/25/14 0847  BP: 148/64  Pulse: 87  Temp: 97.5 F (36.4 C)  TempSrc: Oral  Resp: 18  Weight: 160 lb (72.576 kg)  SpO2: 97%      Assessment & Plan:

## 2014-05-25 NOTE — Patient Instructions (Signed)
We think that it is a muscle strain under the arm from the fall. We would like you to get a cane so that you can use it around the house to give you extra support on your knee. It is also okay to use the walker when you need it.   You can use a heating pad on the area to help with the ache. If you need to take medicine I would recommend trying tylenol for the pain. You can take 1 pill every 6 hours of the tylenol.   If you have more problems with your knee or your balance please call us or come back.   Muscle Strain A muscle strain is an injury that occurs when a muscle is stretched beyond its normal length. Usually a small number of muscle fibers are torn when this happens. Muscle strain is rated in degrees. First-degree strains have the least amount of muscle fiber tearing and pain. Second-degree and third-degree strains have increasingly more tearing and pain.  Usually, recovery from muscle strain takes 1-2 weeks. Complete healing takes 5-6 weeks.  CAUSES  Muscle strain happens when a sudden, violent force placed on a muscle stretches it too far. This may occur with lifting, sports, or a fall.  RISK FACTORS Muscle strain is especially common in athletes.  SIGNS AND SYMPTOMS At the site of the muscle strain, there may be:  Pain.  Bruising.  Swelling.  Difficulty using the muscle due to pain or lack of normal function. DIAGNOSIS  Your health care provider will perform a physical exam and ask about your medical history. TREATMENT  Often, the best treatment for a muscle strain is resting, icing, and applying cold compresses to the injured area.  HOME CARE INSTRUCTIONS   Use the PRICE method of treatment to promote muscle healing during the first 2-3 days after your injury. The PRICE method involves:  Protecting the muscle from being injured again.  Restricting your activity and resting the injured body part.  Icing your injury. To do this, put ice in a plastic bag. Place a towel  between your skin and the bag. Then, apply the ice and leave it on from 15-20 minutes each hour. After the third day, switch to moist heat packs.  Apply compression to the injured area with a splint or elastic bandage. Be careful not to wrap it too tightly. This may interfere with blood circulation or increase swelling.  Elevate the injured body part above the level of your heart as often as you can.  Only take over-the-counter or prescription medicines for pain, discomfort, or fever as directed by your health care provider.  Warming up prior to exercise helps to prevent future muscle strains. SEEK MEDICAL CARE IF:   You have increasing pain or swelling in the injured area.  You have numbness, tingling, or a significant loss of strength in the injured area. MAKE SURE YOU:   Understand these instructions.  Will watch your condition.  Will get help right away if you are not doing well or get worse. Document Released: 03/06/2005 Document Revised: 12/25/2012 Document Reviewed: 10/03/2012 [facility] Patient Information 2015 [place], [place]. This information is not intended to replace advice given to you by your health care provider. Make sure you discuss any questions you have with your health care provider.

## 2014-06-23 ENCOUNTER — Other Ambulatory Visit: Payer: Self-pay | Admitting: Internal Medicine

## 2014-06-23 ENCOUNTER — Other Ambulatory Visit: Payer: Self-pay | Admitting: Geriatric Medicine

## 2014-06-23 MED ORDER — SIMVASTATIN 40 MG PO TABS: 40.0000 mg | ORAL_TABLET | Freq: Every day | ORAL | Status: DC

## 2014-06-23 MED ORDER — BISOPROLOL-HYDROCHLOROTHIAZIDE 2.5-6.25 MG PO TABS: 1.0000 | ORAL_TABLET | Freq: Every day | ORAL | Status: DC

## 2014-07-07 ENCOUNTER — Ambulatory Visit (INDEPENDENT_AMBULATORY_CARE_PROVIDER_SITE_OTHER): Payer: Commercial Managed Care - HMO | Admitting: Internal Medicine

## 2014-07-07 ENCOUNTER — Encounter: Payer: Self-pay | Admitting: Internal Medicine

## 2014-07-07 VITALS — BP 98/62 | HR 71 | Temp 97.6°F | Resp 16 | Ht 64.0 in | Wt 161.4 lb

## 2014-07-07 DIAGNOSIS — M7050 Other bursitis of knee, unspecified knee: Secondary | ICD-10-CM

## 2014-07-07 DIAGNOSIS — M7150 Other bursitis, not elsewhere classified, unspecified site: Secondary | ICD-10-CM

## 2014-07-07 MED ORDER — MELOXICAM 15 MG PO TABS: ORAL_TABLET | ORAL | Status: DC

## 2014-07-07 NOTE — Progress Notes (Signed)
Pre visit review using our clinic review tool, if applicable. No additional management support is needed unless otherwise documented below in the visit note. 

## 2014-07-07 NOTE — Progress Notes (Signed)
   Subjective:    Patient ID: Sherry Watts, female    DOB: 10/07/1921, 81 y.o.   MRN: [id number]  HPI The patient is coming in for follow up of her arthritis. She had been doing really well but in the last 2-3 weeks her left knee is hurting more. She is out of her meloxicam and thinks that that really had helped. No falls or extra activity. Rates in 3/10 when bad. Worse with walking and no pain at rest.   Review of Systems  Constitutional: Negative for fever, activity change, appetite change, fatigue and unexpected weight change.  Respiratory: Negative.   Cardiovascular: Negative.   Gastrointestinal: Negative.   Musculoskeletal: Positive for myalgias and arthralgias.  Skin: Negative.   Neurological: Negative for dizziness, syncope, weakness, light-headedness and numbness.  Psychiatric/Behavioral: Negative.       Objective:   Physical Exam  Constitutional: She is oriented to person, place, and time. She appears well-developed and well-nourished.  HENT:  Head: Normocephalic and atraumatic.  Eyes: EOM are normal. Pupils are equal, round, and reactive to light.  Neck: Normal range of motion.  Cardiovascular: Normal rate and regular rhythm.   Pulmonary/Chest: Effort normal and breath sounds normal. No respiratory distress. She has no wheezes. She has no rales.  Abdominal: Soft. Bowel sounds are normal.  Musculoskeletal: She exhibits tenderness.  Tenderness in the left pes anserine bursa area, no tenderness over the patella, no swelling in the left knee.   Neurological: She is alert and oriented to person, place, and time. Coordination normal.  Skin: Skin is warm and dry.  Vitals reviewed.  Filed Vitals:   07/07/14 0825  BP: 98/62  Pulse: 71  Temp: 97.6 F (36.4 C)  TempSrc: Oral  Resp: 16  Height: 5\' 4"  (1.626 m)  Weight: 161 lb 6.4 oz (73.211 kg)  SpO2: 93%      Assessment & Plan:

## 2014-07-07 NOTE — Assessment & Plan Note (Signed)
New diagnosis today, trying meloxicam first as well as icing. Gave her some stretching information to try as well. If no relief she will call back.

## 2014-07-07 NOTE — Patient Instructions (Signed)
We have refilled the meloxicam (mobic) for the pain in your knee. You can take it everyday if the pain is bad otherwise just take it when the pain is bad.   Come back in about 6 months or sooner if you have any problems or questions.   Pes Anserinus Syndrome with Rehab The pes anserine, also known as the goose's foot, is an area of the shinbone (tibia) near the knee joint where the tendons of three of the muscles of the thigh insert into the bone. These muscles are important for bending the knee and bringing the leg across the body. Just underneath the three tendons that attach at the pes anserinus exists a fluid filled sac (bursa) that is meant to reduce the friction between the tendons and the tibia. Pes anserinus syndrome is a condition that is characterized by inflammation of the bursa (bursitis) and/ or tendonitis (inflammation of the tendon) and may cause severe pain in the lower portion of the inner (medial) side of the knee. SYMPTOMS   Pain and inflammation over the lower portion of the medial side of the knee.  Pain that worsens as the duration of an activity increases.  Pain that worsens when bending the knee, especially against resistance.  A crackling sound (crepitation) when the tendon or bursa is moved or touched. CAUSES  Bursitis and tendonitis are usually characterized as overuse injuries. Common mechanisms of injury include:  Stress placed on the knee from a sudden increase in the intensity, frequency, or duration of training.  Direct trauma to the upper leg (less common). RISK INCREASES WITH:  Endurance sports (distance running or triathletes).  Making changes to or beginning a new training program.  Sports that place stress on the muscles that insert at the pes anserinus, such as those that require pivoting, cutting, or jumping.  Improper training.  Poor strength and flexibility  Failure to warm-up properly before activity.  Improper knee alignment ( knock  knees).  Arthritis of the knee. PREVENTION  Warm up and stretch properly before activity.  Allow for adequate recovery between workouts.  Maintain physical fitness:  Strength, flexibility, and endurance.  Cardiovascular fitness.  Learn and use training methods that will reduce the stress placed on the pes anserinus.  Arch supports (orthotics) may be helpful for those with flat feet. PROGNOSIS  If treated properly, then the symptoms of pes anserinus syndrome usually resolve within 6 weeks.  RELATED COMPLICATIONS   Persistent and potentially chronic pain if the condition is not treated properly.  Re-injury if activity is resumed before the injury is allowed to heal completely, or if one resumes improper training habits. TREATMENT Treatment initially involves the use of ice and medication to help reduce pain and inflammation. The use of strengthening and stretching exercises may help reduce pain with activity. These exercises may be performed at home or with a therapist. Individuals who have flat feet may find benefit in wearing arch supports in their shoes. Some individuals find that compression bandages or knee sleeves help reduce symptoms. Your caregiver may recommend a corticosteroid injection to help reduce inflammation. If symptoms persist, despite conservative treatment for greater than 6 months, then surgery may be recommended.  MEDICATION   If pain medication is necessary, then nonsteroidal anti-inflammatory medications, such as aspirin and ibuprofen, or other minor pain relievers, such as acetaminophen, are often recommended.  Do not take pain medication for 7 days before surgery.  Prescription pain relievers may be given if deemed necessary by your caregiver. Use  only as directed and only as much as you need.  Corticosteroid injections may be given by your caregiver. These injections should be reserved for the most serious cases, because they may only be given a certain  number of times. SEEK MEDICAL CARE IF:  Treatment seems to offer no benefit, or the condition worsens.  Any medications produce adverse side effects. EXERCISES  RANGE OF MOTION (ROM) AND STRETCHING EXERCISES - Pes Anserinus Syndrome These exercises may help you when beginning to rehabilitate your injury. Your symptoms may resolve with or without further involvement from your physician, physical therapist or athletic trainer. While completing these exercises, remember:   Restoring tissue flexibility helps normal motion to return to the joints. This allows healthier, less painful movement and activity.  An effective stretch should be held for at least 30 seconds.  A stretch should never be painful. You should only feel a gentle lengthening or release in the stretched tissue. STRETCH - Hamstrings, Supine  Lie on your back. Loop a belt or towel over the ball of your right / left foot.  Straighten your right / left knee and slowly pull on the belt to raise your leg. Do not allow the right / left knee to bend. Keep your opposite leg flat on the floor.  Raise the leg until you feel a gentle stretch behind your right / left knee or thigh. Hold this position for __________ seconds. Repeat __________ times. Complete this stretch __________ times per day.  STRETCH - Hamstrings, Doorway  Lie on your back with your right / left leg extended and resting on the wall and the opposite leg flat on the ground through the door. Initially, position your bottom farther away from the wall than the illustration shows.  Keep your right / left knee straight. If you feel a stretch behind your knee or thigh, hold this position for __________ seconds.  If you do not feel a stretch, scoot your bottom closer to the door, and hold __________ seconds. Repeat __________ times. Complete this stretch __________ times per day.  STRETCH - Hamstrings/Adductors, V-Sit  Sit on the floor with your legs extended in a large  "V," keeping your knees straight.  With your head and chest upright, bend at your waist reaching for your right foot to stretch your left adductors.  You should feel a stretch in your left inner thigh. Hold for __________ seconds.  Return to the upright position to relax your leg muscles.  Continuing to keep your chest upright, bend straight forward at your waist to stretch your hamstrings.  You should feel a stretch behind both of your thighs and/or knees. Hold for __________ seconds.  Return to the upright position to relax your leg muscles.  Repeat steps 2 through 4. Repeat __________ times. Complete this exercise __________ times per day.  STRETCH - Hamstrings, Standing  Stand or sit and extend your right / left leg, placing your foot on a chair or foot stool  Keeping a slight arch in your low back and your hips straight forward.  Lead with your chest and lean forward at the waist until you feel a gentle stretch in the back of your right / left knee or thigh. (When done correctly, this exercise requires leaning only a small distance.)  Hold this position for __________ seconds. Repeat __________ times. Complete this stretch __________ times per day. STRETCH - Adductors, Lunge  While standing, spread your legs  Lean away from your right / left leg by bending  your opposite knee. You may rest your hands on your thigh for balance.  You should feel a stretch in your right / left inner thigh. Hold for __________ seconds. Repeat __________ times. Complete this exercise __________ times per day.  STRETCH - Adductors, Standing  Place your right / left foot on a counter or stable table. Turn away from your leg so both hips line up with your right / left leg.  Keeping your hips facing forward, slowly bend your opposite leg until you feel a gentle stretch on the inside of your right / left thigh.  Hold for __________ seconds. Repeat __________ times. Complete this exercise __________  times per day.  STRENGTHENING EXERCISES - Pes Anserinus Syndrome  These exercises may help you when beginning to rehabilitate your injury. They may resolve your symptoms with or without further involvement from your physician, physical therapist or athletic trainer. While completing these exercises, remember:   Muscles can gain both the endurance and the strength needed for everyday activities through controlled exercises.  Complete these exercises as instructed by your physician, physical therapist or athletic trainer. Progress the resistance and repetitions only as guided. STRENGTH - Hamstring, Curls  Lay on your stomach with your legs extended. (If you lay on a bed, your feet may hang over the edge.)  Tighten the muscles in the back of your thigh to bend your right / left knee up to 90 degrees. Keep your hips flat on the bed/floor.  Hold this position for __________ seconds.  Slowly lower your leg back to the starting position. Repeat __________ times. Complete this exercise __________ times per day.  OPTIONAL ANKLE WEIGHTS: Begin with ____________________, but DO NOT exceed ____________________. Increase in 1 lb/0.5 kg increments.  STRENGTH - Hip Adductors, Straight Leg Raises  Lie on your side so that your head, shoulders, knee and hip line up. You may place your upper foot in front to help maintain your balance. Your right / left leg should be on the bottom.  Roll your hips slightly forward, so that your hips are stacked directly over each other and your right / left knee is facing forward.  Tense the muscles in your inner thigh and lift your bottom leg 4-6 inches. Hold this position for __________ seconds.  Slowly lower your leg to the starting position. Allow the muscles to fully relax before beginning the next repetition. Repeat __________ times. Complete this exercise __________ times per day.  Document Released: 03/06/2005 Document Revised: 05/29/2011 Document Reviewed:  06/18/2008 [facility] Patient Information 2015 [place], [place]. This information is not intended to replace advice given to you by your health care provider. Make sure you discuss any questions you have with your health care provider.

## 2014-07-21 ENCOUNTER — Encounter (HOSPITAL_COMMUNITY): Payer: Self-pay | Admitting: Emergency Medicine

## 2014-07-21 ENCOUNTER — Emergency Department (HOSPITAL_COMMUNITY): Payer: Commercial Managed Care - HMO

## 2014-07-21 ENCOUNTER — Emergency Department (HOSPITAL_COMMUNITY)
Admission: EM | Admit: 2014-07-21 | Discharge: 2014-07-21 | Disposition: A | Discharge status: Home / Self Care | Payer: Commercial Managed Care - HMO | Attending: Emergency Medicine | Admitting: Emergency Medicine

## 2014-07-21 DIAGNOSIS — M7042 Prepatellar bursitis, left knee: Secondary | ICD-10-CM | POA: Diagnosis not present

## 2014-07-21 DIAGNOSIS — Z791 Long term (current) use of non-steroidal anti-inflammatories (NSAID): Secondary | ICD-10-CM | POA: Insufficient documentation

## 2014-07-21 DIAGNOSIS — M7052 Other bursitis of knee, left knee: Secondary | ICD-10-CM

## 2014-07-21 DIAGNOSIS — E785 Hyperlipidemia, unspecified: Secondary | ICD-10-CM | POA: Diagnosis not present

## 2014-07-21 DIAGNOSIS — Z79899 Other long term (current) drug therapy: Secondary | ICD-10-CM | POA: Diagnosis not present

## 2014-07-21 DIAGNOSIS — M1990 Unspecified osteoarthritis, unspecified site: Secondary | ICD-10-CM | POA: Diagnosis not present

## 2014-07-21 DIAGNOSIS — Y9389 Activity, other specified: Secondary | ICD-10-CM | POA: Insufficient documentation

## 2014-07-21 DIAGNOSIS — M25462 Effusion, left knee: Secondary | ICD-10-CM | POA: Diagnosis not present

## 2014-07-21 DIAGNOSIS — M25562 Pain in left knee: Secondary | ICD-10-CM | POA: Diagnosis present

## 2014-07-21 MED ORDER — TRAMADOL HCL 50 MG PO TABS: 25.0000 mg | ORAL_TABLET | Freq: Four times a day (QID) | ORAL | Status: DC | PRN

## 2014-07-21 NOTE — ED Provider Notes (Signed)
CSN: [id number]     Arrival date & time 07/21/14  0820 History   First MD Initiated Contact with Patient 07/21/14 0840     Chief Complaint  Patient presents with  . Knee Pain     (Consider location/radiation/quality/duration/timing/severity/associated sxs/prior Treatment) HPI Comments: Patient presents for evaluation of left knee pain. Patient reports that she has been experiencing pain in her left knee for approximately 1 week. She has not had any known injury. She has been using the meloxicam prescribed her doctor without any improvement. She reports that the pain has progressively worsened over the last 7 days and he knee swollen.  Patient is a 81 y.o. female presenting with knee pain.  Knee Pain   Past Medical History  Diagnosis Date  . Allergy   . Hyperlipidemia   . Arthritis    Past Surgical History  Procedure Laterality Date  . Appendectomy    . Exploratory laparotomy    . Tonsillectomy    . Foot fx      Family History  Problem Relation Age of Onset  . Heart attack    . Heart disease Mother   . Hypertension Mother   . Diabetes Daughter    History  Substance Use Topics  . Smoking status: Never Smoker   . Smokeless tobacco: Not on file  . Alcohol Use: No   OB History    No data available     Review of Systems  Respiratory: Negative for shortness of breath.   Cardiovascular: Negative for chest pain.  Musculoskeletal: Positive for arthralgias.  All other systems reviewed and are negative.     Allergies  Review of patient's allergies indicates no known allergies.  Home Medications   Prior to Admission medications   Medication Sig Start Date End Date Taking? Authorizing Provider  bisoprolol-hydrochlorothiazide (ZIAC) 2.5-6.25 MG per tablet Take 1 tablet by mouth daily. 06/23/14  Yes Olga Millers, MD  meloxicam (MOBIC) 15 MG tablet TAKE 1 TABLET BY MOUTH   EVERY DAY 07/07/14  Yes Olga Millers, MD  simvastatin (ZOCOR) 40 MG tablet Take 1 tablet  (40 mg total) by mouth daily. 06/23/14  Yes Olga Millers, MD  Vitamin D, Ergocalciferol, (DRISDOL) 50000 UNITS CAPS capsule Take 1 capsule (50,000 Units total) by mouth once a week. Patient taking differently: Take 50,000 Units by mouth every Wednesday.  05/11/14  Yes Olga Millers, MD  traMADol (ULTRAM) 50 MG tablet Take 0.5-1 tablets (25-50 mg total) by mouth every 6 (six) hours as needed. 07/21/14   Orpah Greek, MD   BP 136/59 mmHg  Pulse 75  Temp(Src) 97.7 F (36.5 C) (Oral)  Resp 16  SpO2 100% Physical Exam  Constitutional: She is oriented to person, place, and time. She appears well-developed and well-nourished. No distress.  HENT:  Head: Normocephalic and atraumatic.  Right Ear: Hearing normal.  Left Ear: Hearing normal.  Nose: Nose normal.  Mouth/Throat: Oropharynx is clear and moist and mucous membranes are normal.  Eyes: Conjunctivae and EOM are normal. Pupils are equal, round, and reactive to light.  Neck: Normal range of motion. Neck supple.  Cardiovascular: Regular rhythm, S1 normal and S2 normal.  Exam reveals no gallop and no friction rub.   No murmur heard. Pulmonary/Chest: Effort normal and breath sounds normal. No respiratory distress. She exhibits no tenderness.  Abdominal: Soft. Normal appearance and bowel sounds are normal. There is no hepatosplenomegaly. There is no tenderness. There is no rebound, no guarding, no tenderness at  McBurney's point and negative Murphy's sign. No hernia.  Musculoskeletal: Normal range of motion.       Left knee: She exhibits swelling. She exhibits normal range of motion, no deformity and no erythema.       Legs: Tenderness, venous cords. Negative Homans.  Neurological: She is alert and oriented to person, place, and time. She has normal strength. No cranial nerve deficit or sensory deficit. Coordination normal. GCS eye subscore is 4. GCS verbal subscore is 5. GCS motor subscore is 6.  Skin: Skin is warm, dry and intact.  No rash noted. No cyanosis.  Psychiatric: She has a normal mood and affect. Her speech is normal and behavior is normal. Thought content normal.  Nursing note and vitals reviewed.   ED Course  Procedures (including critical care time) Labs Review Labs Reviewed - No data to display  Imaging Review Dg Knee Complete 4 Views Left  07/21/2014   CLINICAL DATA:  Generalized left knee pain, increasing over last 5-7 days. No known injury.  EXAM: LEFT KNEE - COMPLETE 4+ VIEW  COMPARISON:  None.  FINDINGS: Small joint effusion. No acute bony abnormality. Specifically, no fracture, subluxation, or dislocation. Soft tissues are intact.  IMPRESSION: Small joint effusion.  No acute bony abnormality.   Electronically Signed   By: Rolm Baptise M.D.   On: 07/21/2014 10:11     EKG Interpretation None      MDM   Final diagnoses:  Suprapatellar bursitis of left knee    Patient presents to the ER for evaluation of atraumatic pain of the left knee, ongoing for one week. Patient reports progressive worsening of pain. She is using Mobic without relief. Patient does have very slight suprapatellar effusion noted on examination, no significant joint effusion palpable. There is no overlying erythema or skin changes, no warmth, no signs of infection. Patient has never seen an orthopedic physician before. She was referred to on-call orthopedist, Dr. Lorre Nick. She was given low-dose Ultram to be used as needed for pain in addition to the mobile, elevate and stay off her he has much as possible. Patient was given precautions to return for any fever, redness, increased swelling.    Orpah Greek, MD 07/21/14 1045

## 2014-07-21 NOTE — ED Notes (Signed)
Pt c/o left knee pain onset 7 days ago, denies any injury to area. Some edema present, pt denies recent long car trip or air travel.

## 2014-07-21 NOTE — ED Notes (Signed)
Patient transported to X-ray 

## 2014-07-21 NOTE — Discharge Instructions (Signed)
Bursitis °Bursitis is a swelling and soreness (inflammation) of a fluid-filled sac (bursa) that overlies and protects a joint. It can be caused by injury, overuse of the joint, arthritis or infection. The joints most likely to be affected are the elbows, shoulders, hips and knees. °HOME CARE INSTRUCTIONS  °· Apply ice to the affected area for 15-20 minutes each hour while awake for 2 days. Put the ice in a plastic bag and place a towel between the bag of ice and your skin. °· Rest the injured joint as much as possible, but continue to put the joint through a full range of motion, 4 times per day. (The shoulder joint especially becomes rapidly "frozen" if not used.) When the pain lessens, begin normal slow movements and usual activities. °· Only take over-the-counter or prescription medicines for pain, discomfort or fever as directed by your caregiver. °· Your caregiver may recommend draining the bursa and injecting medicine into the bursa. This may help the healing process. °· Follow all instructions for follow-up with your caregiver. This includes any orthopedic referrals, physical therapy and rehabilitation. Any delay in obtaining necessary care could result in a delay or failure of the bursitis to heal and chronic pain. °SEEK IMMEDIATE MEDICAL CARE IF:  °· Your pain increases even during treatment. °· You develop an oral temperature above 102° F (38.9° C) and have heat and inflammation over the involved bursa. °MAKE SURE YOU:  °· Understand these instructions. °· Will watch your condition. °· Will get help right away if you are not doing well or get worse. °Document Released: 03/03/2000 Document Revised: 05/29/2011 Document Reviewed: 05/26/2013 °[employer]® Patient Information ©2015 [employer]. This information is not intended to replace advice given to you by your health care provider. Make sure you discuss any questions you have with your health care provider. ° °

## 2014-07-21 NOTE — ED Notes (Signed)
MD at bedside. 

## 2014-08-06 DIAGNOSIS — M25462 Effusion, left knee: Secondary | ICD-10-CM | POA: Diagnosis not present

## 2014-08-06 DIAGNOSIS — M1712 Unilateral primary osteoarthritis, left knee: Secondary | ICD-10-CM | POA: Diagnosis not present

## 2014-08-06 DIAGNOSIS — M25562 Pain in left knee: Secondary | ICD-10-CM | POA: Diagnosis not present

## 2015-01-06 ENCOUNTER — Other Ambulatory Visit (INDEPENDENT_AMBULATORY_CARE_PROVIDER_SITE_OTHER): Payer: Commercial Managed Care - HMO

## 2015-01-06 ENCOUNTER — Encounter: Payer: Self-pay | Admitting: Internal Medicine

## 2015-01-06 ENCOUNTER — Ambulatory Visit (INDEPENDENT_AMBULATORY_CARE_PROVIDER_SITE_OTHER): Payer: Commercial Managed Care - HMO | Admitting: Internal Medicine

## 2015-01-06 VITALS — BP 130/70 | HR 74 | Temp 98.3°F | Resp 12 | Ht 64.0 in | Wt 159.0 lb

## 2015-01-06 DIAGNOSIS — E559 Vitamin D deficiency, unspecified: Secondary | ICD-10-CM

## 2015-01-06 DIAGNOSIS — Z23 Encounter for immunization: Secondary | ICD-10-CM

## 2015-01-06 DIAGNOSIS — Z0000 Encounter for general adult medical examination without abnormal findings: Secondary | ICD-10-CM | POA: Diagnosis not present

## 2015-01-06 DIAGNOSIS — I10 Essential (primary) hypertension: Secondary | ICD-10-CM | POA: Diagnosis not present

## 2015-01-06 DIAGNOSIS — E785 Hyperlipidemia, unspecified: Secondary | ICD-10-CM | POA: Diagnosis not present

## 2015-01-06 LAB — VITAMIN D 25 HYDROXY (VIT D DEFICIENCY, FRACTURES): VITD: 36.33 ng/mL (ref 30.00–100.00)

## 2015-01-06 LAB — COMPREHENSIVE METABOLIC PANEL
ALT: 10 U/L (ref 0–35)
AST: 18 U/L (ref 0–37)
Albumin: 4 g/dL (ref 3.5–5.2)
Alkaline Phosphatase: 67 U/L (ref 39–117)
BUN: 23 mg/dL (ref 6–23)
CHLORIDE: 108 meq/L (ref 96–112)
CO2: 26 meq/L (ref 19–32)
CREATININE: 1.13 mg/dL (ref 0.40–1.20)
Calcium: 9.6 mg/dL (ref 8.4–10.5)
GFR: 58.06 mL/min — ABNORMAL LOW (ref >60.00)
Glucose, Bld: 102 mg/dL — ABNORMAL HIGH (ref 70–99)
Potassium: 4.3 mEq/L (ref 3.5–5.1)
SODIUM: 143 meq/L (ref 135–145)
Total Bilirubin: 0.4 mg/dL (ref 0.2–1.2)
Total Protein: 7.4 g/dL (ref 6.0–8.3)

## 2015-01-06 LAB — LIPID PANEL
CHOL/HDL RATIO: 7
Cholesterol: 315 mg/dL — ABNORMAL HIGH (ref 0–200)
HDL: 44.9 mg/dL (ref >39.00)
LDL CALC: 244 mg/dL — AB (ref 0–99)
NonHDL: 269.95
TRIGLYCERIDES: 130 mg/dL (ref 0.0–149.0)
VLDL: 26 mg/dL (ref 0.0–40.0)

## 2015-01-06 MED ORDER — BISOPROLOL-HYDROCHLOROTHIAZIDE 2.5-6.25 MG PO TABS: 1.0000 | ORAL_TABLET | Freq: Every day | ORAL | Status: DC

## 2015-01-06 MED ORDER — MELOXICAM 15 MG PO TABS: ORAL_TABLET | ORAL | Status: DC

## 2015-01-06 MED ORDER — SIMVASTATIN 40 MG PO TABS: 40.0000 mg | ORAL_TABLET | Freq: Every day | ORAL | Status: DC

## 2015-01-06 NOTE — Assessment & Plan Note (Signed)
Checking labs today, aged out of colon and mammogram screening. Given flu and prevnar 13 today at visit to update immunizations. She is not sure if she ever got shingles. Talked to her about fall prevention and the need for exercise daily for both her balance and her arthritis. Given 10 year screening recommendations.

## 2015-01-06 NOTE — Assessment & Plan Note (Signed)
Checking lipid panel, continue zocor 40 mg daily and adjust as needed. No side effects.

## 2015-01-06 NOTE — Patient Instructions (Signed)
We will check the blood work today and call you back with the results.  We have sent in the refills and have given you the flu shot.   Health Maintenance, Female Adopting a healthy lifestyle and getting preventive care can go a long way to promote health and wellness. Talk with your health care provider about what schedule of regular examinations is right for you. This is a good chance for you to check in with your provider about disease prevention and staying healthy. In between checkups, there are plenty of things you can do on your own. Experts have done a lot of research about which lifestyle changes and preventive measures are most likely to keep you healthy. Ask your health care provider for more information. WEIGHT AND DIET  Eat a healthy diet  Be sure to include plenty of vegetables, fruits, low-fat dairy products, and lean protein.  Do not eat a lot of foods high in solid fats, added sugars, or salt.  Get regular exercise. This is one of the most important things you can do for your health.  Most adults should exercise for at least 150 minutes each week. The exercise should increase your heart rate and make you sweat (moderate-intensity exercise).  Most adults should also do strengthening exercises at least twice a week. This is in addition to the moderate-intensity exercise.  Maintain a healthy weight  Body mass index (BMI) is a measurement that can be used to identify possible weight problems. It estimates body fat based on height and weight. Your health care provider can help determine your BMI and help you achieve or maintain a healthy weight.  For females 68 years of age and older:   A BMI below 18.5 is considered underweight.  A BMI of 18.5 to 24.9 is normal.  A BMI of 25 to 29.9 is considered overweight.  A BMI of 30 and above is considered obese.  Watch levels of cholesterol and blood lipids  You should start having your blood tested for lipids and cholesterol at  79 years of age, then have this test every 5 years.  You may need to have your cholesterol levels checked more often if:  Your lipid or cholesterol levels are high.  You are older than 79 years of age.  You are at high risk for heart disease.  CANCER SCREENING   Lung Cancer  Lung cancer screening is recommended for adults 70-23 years old who are at high risk for lung cancer because of a history of smoking.  A yearly low-dose CT scan of the lungs is recommended for people who:  Currently smoke.  Have quit within the past 15 years.  Have at least a 30-pack-year history of smoking. A pack year is smoking an average of one pack of cigarettes a day for 1 year.  Yearly screening should continue until it has been 15 years since you quit.  Yearly screening should stop if you develop a health problem that would prevent you from having lung cancer treatment.  Breast Cancer  Practice breast self-awareness. This means understanding how your breasts normally appear and feel.  It also means doing regular breast self-exams. Let your health care provider know about any changes, no matter how small.  If you are in your 20s or 30s, you should have a clinical breast exam (CBE) by a health care provider every 1-3 years as part of a regular health exam.  If you are 61 or older, have a CBE every year. Also  consider having a breast X-ray (mammogram) every year.  If you have a family history of breast cancer, talk to your health care provider about genetic screening.  If you are at high risk for breast cancer, talk to your health care provider about having an MRI and a mammogram every year.  Breast cancer gene (BRCA) assessment is recommended for women who have family members with BRCA-related cancers. BRCA-related cancers include:  Breast.  Ovarian.  Tubal.  Peritoneal cancers.  Results of the assessment will determine the need for genetic counseling and BRCA1 and BRCA2  testing. Cervical Cancer Your health care provider may recommend that you be screened regularly for cancer of the pelvic organs (ovaries, uterus, and vagina). This screening involves a pelvic examination, including checking for microscopic changes to the surface of your cervix (Pap test). You may be encouraged to have this screening done every 3 years, beginning at age 31.  For women ages 67-65, health care providers may recommend pelvic exams and Pap testing every 3 years, or they may recommend the Pap and pelvic exam, combined with testing for human papilloma virus (HPV), every 5 years. Some types of HPV increase your risk of cervical cancer. Testing for HPV may also be done on women of any age with unclear Pap test results.  Other health care providers may not recommend any screening for nonpregnant women who are considered low risk for pelvic cancer and who do not have symptoms. Ask your health care provider if a screening pelvic exam is right for you.  If you have had past treatment for cervical cancer or a condition that could lead to cancer, you need Pap tests and screening for cancer for at least 20 years after your treatment. If Pap tests have been discontinued, your risk factors (such as having a new sexual partner) need to be reassessed to determine if screening should resume. Some women have medical problems that increase the chance of getting cervical cancer. In these cases, your health care provider may recommend more frequent screening and Pap tests. Colorectal Cancer  This type of cancer can be detected and often prevented.  Routine colorectal cancer screening usually begins at 79 years of age and continues through 79 years of age.  Your health care provider may recommend screening at an earlier age if you have risk factors for colon cancer.  Your health care provider may also recommend using home test kits to check for hidden blood in the stool.  A small camera at the end of a  tube can be used to examine your colon directly (sigmoidoscopy or colonoscopy). This is done to check for the earliest forms of colorectal cancer.  Routine screening usually begins at age 25.  Direct examination of the colon should be repeated every 5-10 years through 79 years of age. However, you may need to be screened more often if early forms of precancerous polyps or small growths are found. Skin Cancer  Check your skin from head to toe regularly.  Tell your health care provider about any new moles or changes in moles, especially if there is a change in a mole's shape or color.  Also tell your health care provider if you have a mole that is larger than the size of a pencil eraser.  Always use sunscreen. Apply sunscreen liberally and repeatedly throughout the day.  Protect yourself by wearing long sleeves, pants, a wide-brimmed hat, and sunglasses whenever you are outside. HEART DISEASE, DIABETES, AND HIGH BLOOD PRESSURE   High  blood pressure causes heart disease and increases the risk of stroke. High blood pressure is more likely to develop in:  People who have blood pressure in the high end of the normal range (130-139/85-89 mm Hg).  People who are overweight or obese.  People who are African American.  If you are 30-90 years of age, have your blood pressure checked every 3-5 years. If you are 83 years of age or older, have your blood pressure checked every year. You should have your blood pressure measured twice--once when you are at a hospital or clinic, and once when you are not at a hospital or clinic. Record the average of the two measurements. To check your blood pressure when you are not at a hospital or clinic, you can use:  An automated blood pressure machine at a pharmacy.  A home blood pressure monitor.  If you are between 66 years and 43 years old, ask your health care provider if you should take aspirin to prevent strokes.  Have regular diabetes screenings. This  involves taking a blood sample to check your fasting blood sugar level.  If you are at a normal weight and have a low risk for diabetes, have this test once every three years after 79 years of age.  If you are overweight and have a high risk for diabetes, consider being tested at a younger age or more often. PREVENTING INFECTION  Hepatitis B  If you have a higher risk for hepatitis B, you should be screened for this virus. You are considered at high risk for hepatitis B if:  You were born in a country where hepatitis B is common. Ask your health care provider which countries are considered high risk.  Your parents were born in a high-risk country, and you have not been immunized against hepatitis B (hepatitis B vaccine).  You have HIV or AIDS.  You use needles to inject street drugs.  You live with someone who has hepatitis B.  You have had sex with someone who has hepatitis B.  You get hemodialysis treatment.  You take certain medicines for conditions, including cancer, organ transplantation, and autoimmune conditions. Hepatitis C  Blood testing is recommended for:  Everyone born from [birth year] through 1963.  Anyone with known risk factors for hepatitis C. Sexually transmitted infections (STIs)  You should be screened for sexually transmitted infections (STIs) including gonorrhea and chlamydia if:  You are sexually active and are younger than 79 years of age.  You are older than 79 years of age and your health care provider tells you that you are at risk for this type of infection.  Your sexual activity has changed since you were last screened and you are at an increased risk for chlamydia or gonorrhea. Ask your health care provider if you are at risk.  If you do not have HIV, but are at risk, it may be recommended that you take a prescription medicine daily to prevent HIV infection. This is called pre-exposure prophylaxis (PrEP). You are considered at risk if:  You are  sexually active and do not regularly use condoms or know the HIV status of your partner(s).  You take drugs by injection.  You are sexually active with a partner who has HIV. Talk with your health care provider about whether you are at high risk of being infected with HIV. If you choose to begin PrEP, you should first be tested for HIV. You should then be tested every 3 months for as long  as you are taking PrEP.  PREGNANCY   If you are premenopausal and you may become pregnant, ask your health care provider about preconception counseling.  If you may become pregnant, take 400 to 800 micrograms (mcg) of folic acid every day.  If you want to prevent pregnancy, talk to your health care provider about birth control (contraception). OSTEOPOROSIS AND MENOPAUSE   Osteoporosis is a disease in which the bones lose minerals and strength with aging. This can result in serious bone fractures. Your risk for osteoporosis can be identified using a bone density scan.  If you are 65 years of age or older, or if you are at risk for osteoporosis and fractures, ask your health care provider if you should be screened.  Ask your health care provider whether you should take a calcium or vitamin D supplement to lower your risk for osteoporosis.  Menopause may have certain physical symptoms and risks.  Hormone replacement therapy may reduce some of these symptoms and risks. Talk to your health care provider about whether hormone replacement therapy is right for you.  HOME CARE INSTRUCTIONS   Schedule regular health, dental, and eye exams.  Stay current with your immunizations.   Do not use any tobacco products including cigarettes, chewing tobacco, or electronic cigarettes.  If you are pregnant, do not drink alcohol.  If you are breastfeeding, limit how much and how often you drink alcohol.  Limit alcohol intake to no more than 1 drink per day for nonpregnant women. One drink equals 12 ounces of beer, 5  ounces of wine, or 1 ounces of hard liquor.  Do not use street drugs.  Do not share needles.  Ask your health care provider for help if you need support or information about quitting drugs.  Tell your health care provider if you often feel depressed.  Tell your health care provider if you have ever been abused or do not feel safe at home.   This information is not intended to replace advice given to you by your health care provider. Make sure you discuss any questions you have with your health care provider.   Document Released: 09/19/2010 Document Revised: 03/27/2014 Document Reviewed: 02/05/2013 Elsevier Interactive Patient Education 2016 [employer].  

## 2015-01-06 NOTE — Assessment & Plan Note (Signed)
BP controlled on bisoprolol-hctz. Checking CMP today and adjust as needed.

## 2015-01-06 NOTE — Assessment & Plan Note (Signed)
Checking vitamin D levels and will stop high dose vitamin D 50000 unit supplementation. Can start OTC vitamin D.

## 2015-01-06 NOTE — Progress Notes (Signed)
Pre visit review using our clinic review tool, if applicable. No additional management support is needed unless otherwise documented below in the visit note. 

## 2015-01-06 NOTE — Progress Notes (Signed)
   Subjective:    Patient ID: Sherry Watts, female    DOB: 15-Jun-1921, 81 y.o.   MRN: [id number]  HPI Here for medicare wellness, no new complaints. Please see A/P for status and treatment of chronic medical problems.   Diet: heart healthy Physical activity: sedentary Depression/mood screen: negative Hearing: intact to whispered voice Visual acuity: grossly normal, performs annual eye exam  ADLs: capable Fall risk: none Home safety: good Cognitive evaluation: intact to orientation, naming, recall and repetition EOL planning: adv directives discussed  I have personally reviewed and have noted 1. The patient's medical and social history - reviewed today no changes 2. Their use of alcohol, tobacco or illicit drugs 3. Their current medications and supplements 4. The patient's functional ability including ADL's, fall risks, home safety risks and hearing or visual impairment. 5. Diet and physical activities 6. Evidence for depression or mood disorders 7. Care team reviewed and updated (available in snapshot)  Review of Systems  Constitutional: Negative for fever, activity change, appetite change and unexpected weight change.  HENT: Negative.   Eyes: Negative.   Respiratory: Negative for cough, chest tightness, shortness of breath and wheezing.   Cardiovascular: Negative for chest pain, palpitations and leg swelling.  Gastrointestinal: Negative for abdominal pain, diarrhea, constipation and abdominal distention.  Musculoskeletal: Positive for arthralgias. Negative for myalgias, back pain and gait problem.  Skin: Negative.   Neurological: Negative for dizziness, weakness, light-headedness and headaches.  Psychiatric/Behavioral: Negative.       Objective:   Physical Exam  Constitutional: She appears well-developed and well-nourished. No distress.  HENT:  Head: Normocephalic and atraumatic.  Nose: Nose normal.  Mouth/Throat: Oropharynx is clear and moist.  Eyes: EOM are normal.    Neck: Normal range of motion.  Cardiovascular: Normal rate and regular rhythm.   No murmur heard. Pulmonary/Chest: Effort normal and breath sounds normal. No respiratory distress. She has no wheezes. She has no rales.  Abdominal: Soft. Bowel sounds are normal.  Neurological: She is alert. Coordination normal.  Skin: Skin is warm and dry.  Psychiatric: She has a normal mood and affect.  Vitals reviewed.  Filed Vitals:   01/06/15 0823  BP: 130/70  Pulse: 74  Temp: 98.3 F (36.8 C)  TempSrc: Oral  Resp: 12  Height: 5\' 4"  (1.626 m)  Weight: 159 lb (72.122 kg)  SpO2: 96%      Assessment & Plan:  Flu and Prevnar 13 given at visit.

## 2015-01-14 ENCOUNTER — Other Ambulatory Visit: Payer: Self-pay | Admitting: Internal Medicine

## 2015-01-14 MED ORDER — SIMVASTATIN 40 MG PO TABS: 40.0000 mg | ORAL_TABLET | Freq: Every day | ORAL | Status: DC

## 2015-04-23 ENCOUNTER — Telehealth: Payer: Self-pay | Admitting: Internal Medicine

## 2015-04-23 NOTE — Telephone Encounter (Signed)
Ally at [facility] is needing a [employer] referral for pt Diagnosis code is [phone]

## 2015-04-23 NOTE — Telephone Encounter (Signed)
[employer] auth submitted. Awaiting approval. 

## 2015-05-03 NOTE — Telephone Encounter (Signed)
Josem Kaufmann [id number] valid 04/23/2015 - 10/20/2015 for 6 visits

## 2015-07-07 ENCOUNTER — Ambulatory Visit: Payer: Commercial Managed Care - HMO | Admitting: Internal Medicine

## 2015-07-07 ENCOUNTER — Telehealth: Payer: Self-pay | Admitting: Internal Medicine

## 2015-07-07 DIAGNOSIS — Z0289 Encounter for other administrative examinations: Secondary | ICD-10-CM

## 2015-07-07 NOTE — Telephone Encounter (Signed)
Patient no showed for 6 month fu.  Please advise.  °

## 2015-07-08 NOTE — Telephone Encounter (Signed)
Call and see if she wants to reschedule.

## 2015-07-09 NOTE — Telephone Encounter (Signed)
Got patient rescheduled

## 2015-08-13 ENCOUNTER — Ambulatory Visit: Payer: Commercial Managed Care - HMO | Admitting: Internal Medicine

## 2015-08-13 DIAGNOSIS — Z0289 Encounter for other administrative examinations: Secondary | ICD-10-CM

## 2015-09-09 ENCOUNTER — Encounter (HOSPITAL_COMMUNITY): Payer: Self-pay | Admitting: Emergency Medicine

## 2015-09-09 ENCOUNTER — Emergency Department (HOSPITAL_COMMUNITY)
Admission: EM | Admit: 2015-09-09 | Discharge: 2015-09-09 | Disposition: A | Discharge status: Home / Self Care | Payer: Commercial Managed Care - HMO | Attending: Emergency Medicine | Admitting: Emergency Medicine

## 2015-09-09 ENCOUNTER — Other Ambulatory Visit: Payer: Self-pay

## 2015-09-09 DIAGNOSIS — N390 Urinary tract infection, site not specified: Secondary | ICD-10-CM | POA: Diagnosis not present

## 2015-09-09 DIAGNOSIS — R55 Syncope and collapse: Secondary | ICD-10-CM | POA: Insufficient documentation

## 2015-09-09 DIAGNOSIS — R42 Dizziness and giddiness: Secondary | ICD-10-CM | POA: Diagnosis not present

## 2015-09-09 DIAGNOSIS — R404 Transient alteration of awareness: Secondary | ICD-10-CM | POA: Diagnosis not present

## 2015-09-09 LAB — BASIC METABOLIC PANEL
Anion gap: 8 (ref 5–15)
BUN: 18 mg/dL (ref 6–20)
CALCIUM: 9.1 mg/dL (ref 8.9–10.3)
CO2: 24 mmol/L (ref 22–32)
CREATININE: 1.15 mg/dL — AB (ref 0.44–1.00)
Chloride: 110 mmol/L (ref 101–111)
GFR calc non Af Amer: 40 mL/min — ABNORMAL LOW (ref >60)
GFR, EST AFRICAN AMERICAN: 47 mL/min — AB (ref >60)
Glucose, Bld: 113 mg/dL — ABNORMAL HIGH (ref 65–99)
Potassium: 3.9 mmol/L (ref 3.5–5.1)
SODIUM: 142 mmol/L (ref 135–145)

## 2015-09-09 LAB — CBC
HCT: 43 % (ref 36.0–46.0)
Hemoglobin: 13.4 g/dL (ref 12.0–15.0)
MCH: 29.9 pg (ref 26.0–34.0)
MCHC: 31.2 g/dL (ref 30.0–36.0)
MCV: 96 fL (ref 78.0–100.0)
PLATELETS: 177 10*3/uL (ref 150–400)
RBC: 4.48 MIL/uL (ref 3.87–5.11)
RDW: 14.4 % (ref 11.5–15.5)
WBC: 7.6 10*3/uL (ref 4.0–10.5)

## 2015-09-09 LAB — URINE MICROSCOPIC-ADD ON: RBC / HPF: NONE SEEN RBC/hpf (ref 0–5)

## 2015-09-09 LAB — CBG MONITORING, ED: Glucose-Capillary: 82 mg/dL (ref 65–99)

## 2015-09-09 LAB — URINALYSIS, ROUTINE W REFLEX MICROSCOPIC
Bilirubin Urine: NEGATIVE
GLUCOSE, UA: NEGATIVE mg/dL
HGB URINE DIPSTICK: NEGATIVE
KETONES UR: 15 mg/dL — AB
Nitrite: NEGATIVE
PH: 5.5 (ref 5.0–8.0)
PROTEIN: NEGATIVE mg/dL
Specific Gravity, Urine: 1.025 (ref 1.005–1.030)

## 2015-09-09 MED ORDER — SODIUM CHLORIDE 0.9 % IV BOLUS (SEPSIS)
500.0000 mL | Freq: Once | INTRAVENOUS | Status: AC
  Administered 2015-09-09: 500 mL via INTRAVENOUS

## 2015-09-09 MED ORDER — CEPHALEXIN 500 MG PO CAPS: 500.0000 mg | ORAL_CAPSULE | Freq: Three times a day (TID) | ORAL | Status: DC

## 2015-09-09 NOTE — ED Notes (Signed)
Checked patient blood sugar it was 82 notified RN of blood sugar

## 2015-09-09 NOTE — ED Notes (Signed)
Pt responds that she understands instructions and is able to give teach back. Home via w/c

## 2015-09-09 NOTE — ED Notes (Signed)
Pt oob to br with steady gait 

## 2015-09-09 NOTE — ED Notes (Signed)
Onset today woke up with dizziness at 0730 ambulated to the bathroom and dizziness continued. Since then dizziness resolved. Patient alert answering and following commands appropriate. Denies pain.

## 2015-09-09 NOTE — ED Provider Notes (Signed)
CSN: [id number]     Arrival date & time 09/09/15  [id number] History   First MD Initiated Contact with Patient 09/09/15 1013     Chief Complaint  Patient presents with  . Dizziness     (Consider location/radiation/quality/duration/timing/severity/associated sxs/prior Treatment) HPI Comments: Patient is a 81 year old female with little medical history. She presents for evaluation of dizziness. She reports that she woke up this morning at approximately 7:30. As she ambulated to the bathroom she reports feeling lightheaded. This lasted for several minutes, then resolved. She denies any chest pain or palpitations. She denies any actual syncope or loss of consciousness. She states there was a friend at her apartment who then called 911. She currently feels fine and has no complaints.  She also tells me that she always seems to run a low blood pressure and describes herself as a "fainter".  Patient is a 82 y.o. female presenting with dizziness. The history is provided by the patient.  Dizziness Quality:  Lightheadedness Severity:  Moderate Onset quality:  Sudden Progression:  Resolved Chronicity:  New Relieved by:  Nothing Worsened by:  Nothing Ineffective treatments:  None tried Associated symptoms: no blood in stool, no chest pain, no diarrhea, no headaches, no palpitations and no shortness of breath     Past Medical History  Diagnosis Date  . Allergy   . Hyperlipidemia   . Arthritis    Past Surgical History  Procedure Laterality Date  . Appendectomy    . Exploratory laparotomy    . Tonsillectomy    . Foot fx      Family History  Problem Relation Age of Onset  . Heart attack    . Heart disease Mother   . Hypertension Mother   . Diabetes Daughter    Social History  Substance Use Topics  . Smoking status: Never Smoker   . Smokeless tobacco: None  . Alcohol Use: No   OB History    No data available     Review of Systems  Respiratory: Negative for shortness of breath.    Cardiovascular: Negative for chest pain and palpitations.  Gastrointestinal: Negative for diarrhea and blood in stool.  Neurological: Positive for dizziness. Negative for headaches.  All other systems reviewed and are negative.     Allergies  Review of patient's allergies indicates no known allergies.  Home Medications   Prior to Admission medications   Medication Sig Start Date End Date Taking? Authorizing Provider  bisoprolol-hydrochlorothiazide (ZIAC) 2.5-6.25 MG tablet Take 1 tablet by mouth daily. 01/06/15   Hoyt Koch, MD  meloxicam (MOBIC) 15 MG tablet TAKE 1 TABLET BY MOUTH   EVERY DAY 01/06/15   Hoyt Koch, MD  simvastatin (ZOCOR) 40 MG tablet Take 1 tablet (40 mg total) by mouth daily. 01/14/15   Hoyt Koch, MD  traMADol (ULTRAM) 50 MG tablet Take 0.5-1 tablets (25-50 mg total) by mouth every 6 (six) hours as needed. 07/21/14   Orpah Greek, MD   BP 119/51 mmHg  Pulse 56  Temp(Src) 97.7 F (36.5 C) (Oral)  Resp 16  Ht 5\' 4"  (1.626 m)  Wt 140 lb (63.504 kg)  BMI 24.02 kg/m2  SpO2 98% Physical Exam  Constitutional: She is oriented to person, place, and time. She appears well-developed and well-nourished. No distress.  HENT:  Head: Normocephalic and atraumatic.  Eyes: EOM are normal. Pupils are equal, round, and reactive to light.  Neck: Normal range of motion. Neck supple.  Cardiovascular: Normal rate and regular  rhythm.  Exam reveals no gallop and no friction rub.   No murmur heard. Pulmonary/Chest: Effort normal and breath sounds normal. No respiratory distress. She has no wheezes.  Abdominal: Soft. Bowel sounds are normal. She exhibits no distension. There is no tenderness.  Musculoskeletal: Normal range of motion. She exhibits no edema.  Neurological: She is alert and oriented to person, place, and time. No cranial nerve deficit. She exhibits normal muscle tone. Coordination normal.  Skin: Skin is warm and dry. She is not  diaphoretic.  Nursing note and vitals reviewed.   ED Course  Procedures (including critical care time) Labs Review Labs Reviewed  BASIC METABOLIC PANEL  CBC  URINALYSIS, ROUTINE W REFLEX MICROSCOPIC (NOT AT [facility])  CBG MONITORING, ED    Imaging Review No results found. I have personally reviewed and evaluated these images and lab results as part of my medical decision-making.   EKG Interpretation   Date/Time:  Thursday September 09 2015 09:56:49 EDT Ventricular Rate:  53 PR Interval:    QRS Duration: 93 QT Interval:  409 QTC Calculation: 384 R Axis:   59 Text Interpretation:  Sinus Bradycardia Abnormal R-wave progression, early  transition Confirmed by Aaradhya Kysar  MD, Lavra Imler ([id number]) on 09/09/2015 10:25:12 AM      MDM   Final diagnoses:  None    Patient is a 82 year old female who presents with complaints of dizziness. This episode occurred this morning and lasted for several minutes. She now feels fine and has no further symptoms. She has been observed for several hours and laboratory studies and vital signs of all remained unremarkable. I'm uncertain as to what happened, however I do not feel as though she requires admission. She is eager to go home and will be discharged with when necessary return.    Veryl Speak, MD 09/09/15 [phone]

## 2015-09-09 NOTE — Discharge Instructions (Signed)
Continue your medications as before.  Return to the emergency department if your symptoms significantly worsen or change.   Dizziness Dizziness is a common problem. It is a feeling of unsteadiness or light-headedness. You may feel like you are about to faint. Dizziness can lead to injury if you stumble or fall. Anyone can become dizzy, but dizziness is more common in older adults. This condition can be caused by a number of things, including medicines, dehydration, or illness. HOME CARE INSTRUCTIONS Taking these steps may help with your condition: Eating and Drinking  Drink enough fluid to keep your urine clear or pale yellow. This helps to keep you from becoming dehydrated. Try to drink more clear fluids, such as water.  Do not drink alcohol.  Limit your caffeine intake if directed by your health care provider.  Limit your salt intake if directed by your health care provider. Activity  Avoid making quick movements.  Rise slowly from chairs and steady yourself until you feel okay.  In the morning, first sit up on the side of the bed. When you feel okay, stand slowly while you hold onto something until you know that your balance is fine.  Move your legs often if you need to stand in one place for a long time. Tighten and relax your muscles in your legs while you are standing.  Do not drive or operate heavy machinery if you feel dizzy.  Avoid bending down if you feel dizzy. Place items in your home so that they are easy for you to reach without leaning over. Lifestyle  Do not use any tobacco products, including cigarettes, chewing tobacco, or electronic cigarettes. If you need help quitting, ask your health care provider.  Try to reduce your stress level, such as with yoga or meditation. Talk with your health care provider if you need help. General Instructions  Watch your dizziness for any changes.  Take medicines only as directed by your health care provider. Talk with your  health care provider if you think that your dizziness is caused by a medicine that you are taking.  Tell a friend or a family member that you are feeling dizzy. If he or she notices any changes in your behavior, have this person call your health care provider.  Keep all follow-up visits as directed by your health care provider. This is important. SEEK MEDICAL CARE IF:  Your dizziness does not go away.  Your dizziness or light-headedness gets worse.  You feel nauseous.  You have reduced hearing.  You have new symptoms.  You are unsteady on your feet or you feel like the room is spinning. SEEK IMMEDIATE MEDICAL CARE IF:  You vomit or have diarrhea and are unable to eat or drink anything.  You have problems talking, walking, swallowing, or using your arms, hands, or legs.  You feel generally weak.  You are not thinking clearly or you have trouble forming sentences. It may take a friend or family member to notice this.  You have chest pain, abdominal pain, shortness of breath, or sweating.  Your vision changes.  You notice any bleeding.  You have a headache.  You have neck pain or a stiff neck.  You have a fever.   This information is not intended to replace advice given to you by your health care provider. Make sure you discuss any questions you have with your health care provider.   Document Released: 08/30/2000 Document Revised: 07/21/2014 Document Reviewed: 03/02/2014 Elsevier Interactive Patient Education 2016 Elsevier  Inc. ° °

## 2016-01-03 ENCOUNTER — Encounter: Payer: Self-pay | Admitting: Internal Medicine

## 2016-01-03 ENCOUNTER — Ambulatory Visit (INDEPENDENT_AMBULATORY_CARE_PROVIDER_SITE_OTHER): Payer: Commercial Managed Care - HMO | Admitting: Internal Medicine

## 2016-01-03 DIAGNOSIS — M170 Bilateral primary osteoarthritis of knee: Secondary | ICD-10-CM | POA: Diagnosis not present

## 2016-01-03 DIAGNOSIS — Z23 Encounter for immunization: Secondary | ICD-10-CM

## 2016-01-03 DIAGNOSIS — G301 Alzheimer's disease with late onset: Secondary | ICD-10-CM

## 2016-01-03 DIAGNOSIS — F0280 Dementia in other diseases classified elsewhere without behavioral disturbance: Secondary | ICD-10-CM

## 2016-01-03 DIAGNOSIS — E785 Hyperlipidemia, unspecified: Secondary | ICD-10-CM

## 2016-01-03 DIAGNOSIS — I10 Essential (primary) hypertension: Secondary | ICD-10-CM

## 2016-01-03 DIAGNOSIS — G309 Alzheimer's disease, unspecified: Secondary | ICD-10-CM

## 2016-01-03 MED ORDER — MELOXICAM 15 MG PO TABS: ORAL_TABLET | ORAL | 0 refills | Status: AC

## 2016-01-03 NOTE — Progress Notes (Signed)
   Subjective:    Patient ID: Sherry Watts, female    DOB: September 06, 1921, 82 y.o.   MRN: [id number]  HPI The patient is a 82 YO female coming in with her friend for her memory. Her friend called in with concerns about her living alone. During the visit she is present as well as the patient. She is somewhat loathe to make concerns known to the patient. When pressed she does mention that she has noticed memory problems and confusing days. She also has a cousin in [place] that came and noticed that she burned some toast. She does make her own breakfast daily and all food on the weekends. She states she usually makes eggs. She denies that the fire alarm has gone off. Her friend mentions that she knows her neighbors well and that they help to look out for her. Her friend denies safety concerns just some memory problems. She does take the bus to the grocery store and cab home. She is able to state her address. She has not gotten lost.  She is also complaining of knee pain in her left knee. She did not get the meloxicam for the knee so is not sure if it worked. Worse in the winter with the cold. Worse with standing from prolonged sitting. Not bad once she starts walking.   Review of Systems  Constitutional: Negative for activity change, appetite change, fatigue, fever and unexpected weight change.  Respiratory: Negative.   Cardiovascular: Negative.   Gastrointestinal: Negative.   Musculoskeletal: Positive for arthralgias. Negative for back pain, gait problem, myalgias and neck pain.  Skin: Negative.   Neurological: Negative for dizziness, weakness, light-headedness, numbness and headaches.       Memory problem  Psychiatric/Behavioral: Positive for confusion. Negative for agitation, behavioral problems, decreased concentration, dysphoric mood, self-injury and suicidal ideas. The patient is not nervous/anxious.       Objective:   Physical Exam  Constitutional: She appears well-developed and  well-nourished.  HENT:  Head: Normocephalic and atraumatic.  Eyes: EOM are normal.  Neck: Normal range of motion.  Cardiovascular: Normal rate and regular rhythm.   Pulmonary/Chest: Effort normal and breath sounds normal.  Abdominal: Soft.  Musculoskeletal: She exhibits no edema.  Neurological: She is alert. Coordination normal.  Not oriented to day of week or date, able to follow 2 step task  Skin: Skin is warm and dry.  Psychiatric:  cheerful   Vitals:   01/03/16 0840  BP: 132/78  Pulse: 73  Resp: 12  Temp: 98.4 F (36.9 C)  TempSrc: Oral  SpO2: 96%  Weight: 159 lb 1.9 oz (72.2 kg)  Height: 5' 4.5" (1.638 m)      Assessment & Plan:  Flu shot given at visit.

## 2016-01-03 NOTE — Assessment & Plan Note (Signed)
Has been off her BP medication for more than 1 year without advice and BP at goal today. Will keep her off medications since she is not hypertensive today.

## 2016-01-03 NOTE — Assessment & Plan Note (Signed)
Rx for meloxicam sent in again and encouraged her to try it for pain as needed.

## 2016-01-03 NOTE — Patient Instructions (Signed)
We have given you the flu shot today.   We would like to see you back in about 3-6 months to check back in on how you are doing.   I think you are having some memory problems and we would like to check in and see how it is doing every 6 months or so. There may come a time when you need some extra help so we'll keep checking on that.   We have sent in a refill on the mobic (meloxicam) which you can take for the knee pain, 1 pill daily as needed.

## 2016-01-03 NOTE — Progress Notes (Signed)
Pre visit review using our clinic review tool, if applicable. No additional management support is needed unless otherwise documented below in the visit note. 

## 2016-01-03 NOTE — Assessment & Plan Note (Signed)
Suspect age related changed to her memory. Some trouble with day of week and date. She is not responsible for her finances (her friend does this) and she is responsible for meals. She has not had any kitchen problems (scorced pans etc) and still takes public transport to grocery store without problems. She does not want to take medication for memory. She is at peace with some age related memory changes. We will check on her every 3 months for safety evaluation and asked her friend to keep a closer eye on her safety. She was not really able to share her concerns today so it is unclear to me if she has more concerns or if all of her concerns were aired today. Did give multiple opportunities for her to share these concerns in front of the patient.

## 2016-01-03 NOTE — Assessment & Plan Note (Signed)
In keeping with her goals of care we will no longer check lipid panel and she is not taking her statin medication which is appropriate since she does not have 10 year life expectancy.

## 2016-05-29 ENCOUNTER — Emergency Department (HOSPITAL_COMMUNITY): Payer: Medicare HMO

## 2016-05-29 ENCOUNTER — Encounter: Payer: Self-pay | Admitting: Internal Medicine

## 2016-05-29 ENCOUNTER — Telehealth: Payer: Self-pay | Admitting: Internal Medicine

## 2016-05-29 ENCOUNTER — Encounter (HOSPITAL_COMMUNITY): Admission: EM | Disposition: E | Payer: Self-pay | Source: Home / Self Care | Attending: Emergency Medicine

## 2016-05-29 ENCOUNTER — Encounter (HOSPITAL_COMMUNITY): Payer: Self-pay

## 2016-05-29 ENCOUNTER — Emergency Department (HOSPITAL_COMMUNITY)
Admission: EM | Admit: 2016-05-29 | Discharge: 2016-06-18 | Disposition: E | Payer: Medicare HMO | Attending: Emergency Medicine | Admitting: Emergency Medicine

## 2016-05-29 ENCOUNTER — Ambulatory Visit: Payer: Commercial Managed Care - HMO | Admitting: Internal Medicine

## 2016-05-29 DIAGNOSIS — I2119 ST elevation (STEMI) myocardial infarction involving other coronary artery of inferior wall: Secondary | ICD-10-CM | POA: Insufficient documentation

## 2016-05-29 DIAGNOSIS — R579 Shock, unspecified: Secondary | ICD-10-CM

## 2016-05-29 DIAGNOSIS — Z9911 Dependence on respirator [ventilator] status: Secondary | ICD-10-CM

## 2016-05-29 DIAGNOSIS — R40241 Glasgow coma scale score 13-15, unspecified time: Secondary | ICD-10-CM

## 2016-05-29 DIAGNOSIS — I4510 Unspecified right bundle-branch block: Secondary | ICD-10-CM | POA: Diagnosis not present

## 2016-05-29 DIAGNOSIS — R413 Other amnesia: Secondary | ICD-10-CM | POA: Diagnosis present

## 2016-05-29 DIAGNOSIS — J984 Other disorders of lung: Secondary | ICD-10-CM | POA: Diagnosis not present

## 2016-05-29 DIAGNOSIS — G939 Disorder of brain, unspecified: Secondary | ICD-10-CM | POA: Diagnosis not present

## 2016-05-29 DIAGNOSIS — F0280 Dementia in other diseases classified elsewhere without behavioral disturbance: Secondary | ICD-10-CM | POA: Diagnosis present

## 2016-05-29 DIAGNOSIS — Z79899 Other long term (current) drug therapy: Secondary | ICD-10-CM | POA: Insufficient documentation

## 2016-05-29 DIAGNOSIS — R55 Syncope and collapse: Secondary | ICD-10-CM | POA: Diagnosis not present

## 2016-05-29 DIAGNOSIS — J189 Pneumonia, unspecified organism: Secondary | ICD-10-CM

## 2016-05-29 DIAGNOSIS — R0602 Shortness of breath: Secondary | ICD-10-CM | POA: Diagnosis present

## 2016-05-29 DIAGNOSIS — Z7189 Other specified counseling: Secondary | ICD-10-CM

## 2016-05-29 DIAGNOSIS — G309 Alzheimer's disease, unspecified: Secondary | ICD-10-CM | POA: Insufficient documentation

## 2016-05-29 DIAGNOSIS — I469 Cardiac arrest, cause unspecified: Secondary | ICD-10-CM | POA: Diagnosis not present

## 2016-05-29 DIAGNOSIS — I10 Essential (primary) hypertension: Secondary | ICD-10-CM | POA: Insufficient documentation

## 2016-05-29 DIAGNOSIS — R404 Transient alteration of awareness: Secondary | ICD-10-CM | POA: Diagnosis not present

## 2016-05-29 DIAGNOSIS — R4020 Unspecified coma: Secondary | ICD-10-CM

## 2016-05-29 DIAGNOSIS — R42 Dizziness and giddiness: Secondary | ICD-10-CM | POA: Diagnosis not present

## 2016-05-29 DIAGNOSIS — M1990 Unspecified osteoarthritis, unspecified site: Secondary | ICD-10-CM | POA: Diagnosis present

## 2016-05-29 DIAGNOSIS — R269 Unspecified abnormalities of gait and mobility: Secondary | ICD-10-CM | POA: Diagnosis not present

## 2016-05-29 DIAGNOSIS — S0990XA Unspecified injury of head, initial encounter: Secondary | ICD-10-CM | POA: Diagnosis not present

## 2016-05-29 DIAGNOSIS — Z4682 Encounter for fitting and adjustment of non-vascular catheter: Secondary | ICD-10-CM | POA: Diagnosis not present

## 2016-05-29 LAB — CBC WITH DIFFERENTIAL/PLATELET
BASOS ABS: 0 10*3/uL (ref 0.0–0.1)
BASOS PCT: 0 %
Eosinophils Absolute: 0.1 10*3/uL (ref 0.0–0.7)
Eosinophils Relative: 1 %
HEMATOCRIT: 42.9 % (ref 36.0–46.0)
HEMOGLOBIN: 13.8 g/dL (ref 12.0–15.0)
LYMPHS PCT: 16 %
Lymphs Abs: 1.5 10*3/uL (ref 0.7–4.0)
MCH: 30.7 pg (ref 26.0–34.0)
MCHC: 32.2 g/dL (ref 30.0–36.0)
MCV: 95.5 fL (ref 78.0–100.0)
Monocytes Absolute: 0.8 10*3/uL (ref 0.1–1.0)
Monocytes Relative: 8 %
NEUTROS ABS: 7 10*3/uL (ref 1.7–7.7)
NEUTROS PCT: 75 %
Platelets: 120 10*3/uL — ABNORMAL LOW (ref 150–400)
RBC: 4.49 MIL/uL (ref 3.87–5.11)
RDW: 14.3 % (ref 11.5–15.5)
WBC: 9.4 10*3/uL (ref 4.0–10.5)

## 2016-05-29 LAB — COMPREHENSIVE METABOLIC PANEL
ALBUMIN: 4 g/dL (ref 3.5–5.0)
ALK PHOS: 83 U/L (ref 38–126)
ALT: 12 U/L — AB (ref 14–54)
AST: 23 U/L (ref 15–41)
Anion gap: 9 (ref 5–15)
BILIRUBIN TOTAL: 0.8 mg/dL (ref 0.3–1.2)
BUN: 21 mg/dL — AB (ref 6–20)
CO2: 24 mmol/L (ref 22–32)
CREATININE: 1 mg/dL (ref 0.44–1.00)
Calcium: 9 mg/dL (ref 8.9–10.3)
Chloride: 109 mmol/L (ref 101–111)
GFR calc Af Amer: 55 mL/min — ABNORMAL LOW (ref >60)
GFR calc non Af Amer: 47 mL/min — ABNORMAL LOW (ref >60)
GLUCOSE: 108 mg/dL — AB (ref 65–99)
Potassium: 4.1 mmol/L (ref 3.5–5.1)
Sodium: 142 mmol/L (ref 135–145)
TOTAL PROTEIN: 7.5 g/dL (ref 6.5–8.1)

## 2016-05-29 LAB — I-STAT ARTERIAL BLOOD GAS, ED
ACID-BASE DEFICIT: 18 mmol/L — AB (ref 0.0–2.0)
BICARBONATE: 11.2 mmol/L — AB (ref 20.0–28.0)
O2 Saturation: 98 %
TCO2: 12 mmol/L (ref 0–100)
pCO2 arterial: 39.7 mmHg (ref 32.0–48.0)
pH, Arterial: 7.06 — CL (ref 7.350–7.450)
pO2, Arterial: 141 mmHg — ABNORMAL HIGH (ref 83.0–108.0)

## 2016-05-29 LAB — I-STAT TROPONIN, ED: Troponin i, poc: 0.04 ng/mL (ref 0.00–0.08)

## 2016-05-29 LAB — CBG MONITORING, ED: GLUCOSE-CAPILLARY: 105 mg/dL — AB (ref 65–99)

## 2016-05-29 LAB — BRAIN NATRIURETIC PEPTIDE: B Natriuretic Peptide: 150.7 pg/mL — ABNORMAL HIGH (ref 0.0–100.0)

## 2016-05-29 SURGERY — LEFT HEART CATH AND CORONARY ANGIOGRAPHY

## 2016-05-29 MED ORDER — PANTOPRAZOLE SODIUM 40 MG IV SOLR: 40.0000 mg | Freq: Every day | INTRAVENOUS | Status: DC

## 2016-05-29 MED ORDER — GLYCOPYRROLATE 0.2 MG/ML IJ SOLN: 0.2000 mg | INTRAMUSCULAR | Status: DC | PRN

## 2016-05-29 MED ORDER — GADOBENATE DIMEGLUMINE 529 MG/ML IV SOLN
10.0000 mL | Freq: Once | INTRAVENOUS | Status: AC
  Administered 2016-05-29: 10 mL via INTRAVENOUS

## 2016-05-29 MED ORDER — SODIUM CHLORIDE 0.9 % IV SOLN
INTRAVENOUS | Status: DC | PRN
  Administered 2016-05-29: 1000 mL via INTRAVENOUS

## 2016-05-29 MED ORDER — SODIUM CHLORIDE 0.9 % IV SOLN
10.0000 mg/h | INTRAVENOUS | Status: DC
  Administered 2016-05-29: 10 mg/h via INTRAVENOUS
  Filled 2016-05-29: qty 10

## 2016-05-29 MED ORDER — BIOTENE DRY MOUTH MT LIQD: 15.0000 mL | OROMUCOSAL | Status: DC | PRN

## 2016-05-29 MED ORDER — PROPOFOL 1000 MG/100ML IV EMUL
5.0000 ug/kg/min | INTRAVENOUS | Status: DC
  Administered 2016-05-29: 5 ug/kg/min via INTRAVENOUS
  Filled 2016-05-29: qty 100

## 2016-05-29 MED ORDER — SODIUM CHLORIDE 0.9 % IV SOLN: Freq: Once | INTRAVENOUS | Status: DC

## 2016-05-29 MED ORDER — GLYCOPYRROLATE 1 MG PO TABS: 1.0000 mg | ORAL_TABLET | ORAL | Status: DC | PRN

## 2016-05-29 MED ORDER — EPINEPHRINE PF 1 MG/10ML IJ SOSY
PREFILLED_SYRINGE | INTRAMUSCULAR | Status: AC | PRN
  Administered 2016-05-29 (×4): 1 mg via INTRAVENOUS

## 2016-05-29 MED ORDER — VANCOMYCIN HCL 10 G IV SOLR
1250.0000 mg | Freq: Once | INTRAVENOUS | Status: DC
  Filled 2016-05-29: qty 1250

## 2016-05-29 MED ORDER — ATROPINE SULFATE 1 MG/ML IJ SOLN
INTRAMUSCULAR | Status: DC | PRN
  Administered 2016-05-29: 1 mg via INTRAVENOUS

## 2016-05-29 MED ORDER — NOREPINEPHRINE BITARTRATE 1 MG/ML IV SOLN
0.0000 ug/min | INTRAVENOUS | Status: DC
  Administered 2016-05-29: 10 ug/min via INTRAVENOUS
  Filled 2016-05-29 (×2): qty 4

## 2016-05-29 MED ORDER — MORPHINE BOLUS VIA INFUSION
5.0000 mg | INTRAVENOUS | Status: DC | PRN
  Filled 2016-05-29: qty 20

## 2016-05-29 MED ORDER — POLYVINYL ALCOHOL 1.4 % OP SOLN: 1.0000 [drp] | Freq: Four times a day (QID) | OPHTHALMIC | Status: DC | PRN

## 2016-05-29 MED ORDER — HEPARIN (PORCINE) IN NACL 100-0.45 UNIT/ML-% IJ SOLN
800.0000 [IU]/h | INTRAMUSCULAR | Status: DC
  Administered 2016-05-29: 800 [IU]/h via INTRAVENOUS
  Filled 2016-05-29: qty 250

## 2016-05-29 MED ORDER — HEPARIN BOLUS VIA INFUSION
3500.0000 [IU] | Freq: Once | INTRAVENOUS | Status: AC
  Administered 2016-05-29: 3500 [IU] via INTRAVENOUS
  Filled 2016-05-29: qty 3500

## 2016-05-29 MED ORDER — SODIUM CHLORIDE 0.9 % IV SOLN: 250.0000 mL | INTRAVENOUS | Status: DC | PRN

## 2016-05-29 MED ORDER — PIPERACILLIN-TAZOBACTAM 3.375 G IVPB 30 MIN: 3.3750 g | Freq: Once | INTRAVENOUS | Status: DC

## 2016-05-29 MED FILL — Medication: Qty: 1 | Status: AC

## 2016-05-30 ENCOUNTER — Telehealth: Payer: Self-pay | Admitting: Nurse Practitioner

## 2016-05-30 NOTE — ED Notes (Addendum)
Patient's ring, removed by tech and another RN during post-mortem care, was given to security. White ring with clear stones placed in security envelope [phone] and handed over to officer.

## 2016-05-30 NOTE — Telephone Encounter (Signed)
Per ED whom called and talked to Dr. Chase Caller family wanted to follow up call regarding patient's care. Dr. Chase Caller instructed me to call family and seek out questions. Called and spoke to Niece Arts development officer, Family concerned about patient not undergoing an ME examination before going to [facility]. Niece states that while in the ED shortly after patient death on 06-23-16  they were told by the ED physician that because of sudden death that the patient would receive an autopsy. Niece states that she has already talked to ED physician, ME, and left a message for the [place] regarding this. Concerns and questions relayed to Dr. Chase Caller.

## 2016-05-30 NOTE — Telephone Encounter (Signed)
I discussed with NP Ms Dewaine Oats and simultaneously had an inquiry from Dr Phoebe Sharps  - chief of Quality - have referred issue to him after discussing with him face to face  Dr. Brand Males, M.D., [facility].C.P Pulmonary and Critical Care Medicine Staff Physician [facility] Pager: [phone], If no answer or between  15:00h - 7:00h: call [phone]  05/30/2016 5:02 PM

## 2016-06-05 ENCOUNTER — Telehealth: Payer: Self-pay

## 2016-06-05 NOTE — Telephone Encounter (Signed)
On 06/05/2016 I received a death certificate from [place] (original). The death certificate is for entombment. The patient is a patient of Doctor Ramaswamy. The death certificate will be taken to Pulmonary Unit @ [facility] this pm for signature.   On 27-Jun-2016 I received the death certificate back from [place]. I got the death certificate ready and called the funeral home to let them know the death certificate is ready for pickup.

## 2016-06-18 NOTE — Assessment & Plan Note (Addendum)
Showing evidence of anoxic encephalopathy but breathign over vent  Plan No induced hypeothermia based on baseline frail health, demential nos and goals of care Fent and versed gtt for vent syncrhony and concomeitant comfort

## 2016-06-18 NOTE — H&P (Signed)
PULMONARY / CRITICAL CARE MEDICINE   Name: Sherry Watts MRN: [id number] DOB: 03/17/1922    ADMISSION DATE:  Jun 03, 2016 CONSULTATION DATE:  03-Jun-2016  REFERRING MD:  Dr. Maryan Rued   CHIEF COMPLAINT:  Cardiac Arrest   HISTORY OF PRESENT ILLNESS:   83 year old female with reported 6 months of memory loss and syncope. Presents to ED on 3/12 with syncope that began when patient felt flushed and the feeling would not go away. On arrival to ED patient was alert and able to follow commands, when she was walking to the bathroom with a staff member she fell and was found pulseless. Patient was coded four times for a total of reported 18 minutes. With return of ROSC patient was found to have a RBBB. Family decided to make patient a DNR and are actively discussing withdrawling care. PCCM called to admit.   PAST MEDICAL HISTORY :  She  has a past medical history of Allergy; Arthritis; and Hyperlipidemia.  PAST SURGICAL HISTORY: She  has a past surgical history that includes Appendectomy; Exploratory laparotomy; Tonsillectomy; and foot fx .  No Known Allergies  No current facility-administered medications on file prior to encounter.    Current Outpatient Prescriptions on File Prior to Encounter  Medication Sig  . meloxicam (MOBIC) 15 MG tablet TAKE 1 TABLET BY MOUTH   EVERY DAY (Patient not taking: Reported on June 03, 2016)    FAMILY HISTORY:  Her @FAMSTP (<SUBSCRIPT> error)@  SOCIAL HISTORY: She  reports that she has never smoked. She has never used smokeless tobacco. She reports that she does not drink alcohol or use drugs.  REVIEW OF SYSTEMS:   Unable to review as patient is vented and unresponsive.   SUBJECTIVE:  On vent and low dose levophed.   VITAL SIGNS: BP 109/65   Pulse 111   Temp 98.1 F (36.7 C) (Oral)   Resp 16   Ht 5\' 4"  (1.626 m)   Wt 65.8 kg (145 lb)   SpO2 100%   BMI 24.89 kg/m   HEMODYNAMICS:    VENTILATOR SETTINGS: Vent Mode: PRVC FiO2 (%):  [100 %] 100  % Set Rate:  [20 bmp] 20 bmp Vt Set:  [450 mL] 450 mL PEEP:  [5 cmH20] 5 cmH20  INTAKE / OUTPUT: No intake/output data recorded.  PHYSICAL EXAMINATION: General:  Elderly female, on vent  Neuro:  No response to physical stimulation, pupils non-reactive  HEENT:  ETT in place  Cardiovascular:  Irregular, No MRG  Lungs:  Diminished breath sounds to bases, non-labored   Abdomen:  Non-distended, active bowel sounds  Musculoskeletal:  No acute  Skin:  Warm, dry, intact   LABS:  BMET  Recent Labs Lab 06/03/2016 1008  NA 142  K 4.1  CL 109  CO2 24  BUN 21*  CREATININE 1.00  GLUCOSE 108*    Electrolytes  Recent Labs Lab 2016/06/03 1008  CALCIUM 9.0    CBC  Recent Labs Lab 03-Jun-2016 1008  WBC 9.4  HGB 13.8  HCT 42.9  PLT 120*    Coag's No results for input(s): APTT, INR in the last 168 hours.  Sepsis Markers No results for input(s): LATICACIDVEN, PROCALCITON, O2SATVEN in the last 168 hours.  ABG No results for input(s): PHART, PCO2ART, PO2ART in the last 168 hours.  Liver Enzymes  Recent Labs Lab 2016-06-03 1008  AST 23  ALT 12*  ALKPHOS 83  BILITOT 0.8  ALBUMIN 4.0    Cardiac Enzymes No results for input(s): TROPONINI, PROBNP in the  last 168 hours.  Glucose  Recent Labs Lab June 20, 2016 1501  GLUCAP 105*    Imaging Dg Chest 2 View  Result Date: 06-20-2016 CLINICAL DATA:  Dizziness and slurred speech EXAM: CHEST  2 VIEW COMPARISON:  December 03, 2009 FINDINGS: There is mild scarring in the right upper lobe. There is also mild scarring in the left base. Lungs elsewhere clear. Heart size and pulmonary vascularity are normal. No adenopathy. There is atherosclerotic calcification in the aorta. No bone lesions. IMPRESSION: Mild scarring right upper lobe and left base. No edema or consolidation. Aortic atherosclerosis. Electronically Signed   By: Lowella Grip III M.D.   On: 2016/06/20 10:29   Ct Head Wo Contrast  Result Date: 06-20-16 CLINICAL  DATA:  Unresponsive.  Fall. EXAM: CT HEAD WITHOUT CONTRAST TECHNIQUE: Contiguous axial images were obtained from the base of the skull through the vertex without intravenous contrast. COMPARISON:  Brain MR obtained earlier today. Head CT obtained earlier today. FINDINGS: Brain: Diffusely enlarged ventricles and subarachnoid spaces. Patchy white matter low density in both cerebral hemispheres. Stable previously described minute calcified extra-axial mass along the underside of the left tentorium. No intracranial hemorrhage or mass effect. Vascular: No hyperdense vessel or unexpected calcification. Skull: Normal. Negative for fracture or focal lesion. Sinuses/Orbits: No acute finding. Other: None. IMPRESSION: 1. No acute abnormality. 2. Stable left tentorial probable meningioma. 3. Stable atrophy and chronic small vessel white matter ischemic changes. Electronically Signed   By: Claudie Revering M.D.   On: 06/20/2016 16:20   Ct Head Wo Contrast  Result Date: June 20, 2016 CLINICAL DATA:  Dizziness, unsteady gait, onset of symptoms upon awakening this morning EXAM: CT HEAD WITHOUT CONTRAST TECHNIQUE: Contiguous axial images were obtained from the base of the skull through the vertex without intravenous contrast. COMPARISON:  None FINDINGS: Brain: Asymmetric positioning in gantry. Generalized atrophy. Normal ventricular morphology. No midline shift or mass effect. Small vessel chronic ischemic changes of deep cerebral white matter. Small soft tissue mass LEFT lateral to the pons, 14 x 11 x 11 mm in size, above the internal auditory canal, slightly hyperdense and likely containing a small calcification. No additional mass lesion, intracranial hemorrhage, or evidence acute infarction. No extra-axial fluid collections. Vascular: Few atherosclerotic calcifications at the carotid siphons Skull: Intact Sinuses/Orbits: Clear Other: N/A IMPRESSION: Atrophy with small vessel chronic ischemic changes of deep cerebral white matter.  Extra-axial mass at LEFT lateral aspect of pons 14 x 11 x 11 mm; this contains a single calcification and is slightly hyperdense versus brain parenchyma. Favor meningioma though tumor arising from cranial nerve or unlikely aneurysm not excluded. Further assessment by MR imaging of the brain with/without contrast recommended to localize and characterize. Findings called to Dr. Maryan Rued On 2016/06/20 at 1110 hours. Electronically Signed   By: Lavonia Dana M.D.   On: 06-20-2016 11:12   Mr Jeri Cos And Wo Contrast  Result Date: 06/20/16 CLINICAL DATA:  83 y/o  F; intracranial mass for further evaluation. EXAM: MRI HEAD WITHOUT AND WITH CONTRAST TECHNIQUE: Multiplanar, multiecho pulse sequences of the brain and surrounding structures were obtained without and with intravenous contrast. CONTRAST:  21mL MULTIHANCE GADOBENATE DIMEGLUMINE 529 MG/ML IV SOLN COMPARISON:  Jun 20, 2016 CT of the head. FINDINGS: Brain: No acute infarction, hemorrhage, hydrocephalus, extra-axial collection or mass lesion. Nonspecific foci of T2 FLAIR hyperintense signal abnormality in subcortical and periventricular white matter are compatible with moderate chronic microvascular ischemic changes. Mild brain parenchymal volume loss. No abnormal enhancement of the brain. Extra-axial mass centered  on the underside of the left tentorium cerebelli near the petroclival ligament just superior to inter auditory canal and exerting mass effect on left trigeminal nerve complex in the pre pontine cistern (series 11, image 14). The mass has a broad dural base and dural tail measuring 16 x 9 mm axially (series 12, image 12). Vascular: Normal flow voids. Skull and upper cervical spine: Normal marrow signal. Sinuses/Orbits: Ethmoid and right frontal sinus mucosal thickening. No abnormal signal of mastoid air cells. Bilateral intra-ocular lens replacement. None Other: None. IMPRESSION: 1. Extra-axial mass on the underside of left tentorium cerebelli near  petroclival ligament with broad dural base and tail likely representing meningioma. Mild mass effect on the left trigeminal nerve in the pre pontine cistern. 2. No acute intracranial abnormality. 3. Moderate chronic microvascular ischemic changes and mild parenchymal volume loss of the brain. 4. Mild paranasal sinus disease. Electronically Signed   By: Kristine Garbe M.D.   On: Jun 03, 2016 14:24   Dg Chest Portable 1 View  Result Date: 06/03/2016 CLINICAL DATA:  Cardiac arrest.  Intubation. EXAM: PORTABLE CHEST 1 VIEW 4:12 p.m. COMPARISON:  June 03, 2016 at 10:08 a.m. FINDINGS: Endotracheal tube is 2 cm above the carina. Heart size and pulmonary vascularity are normal and the lungs are clear. IMPRESSION: Endotracheal tube in good position 2 cm above the carina. Clear lungs. Electronically Signed   By: Lorriane Shire M.D.   On: June 03, 2016 16:25     STUDIES:  CT Head 3/12 > Atrophy with small vessel ischemic changes of deep cerebral white matter, extra-axial mass at left lateral aspect of pons 14 x 11 x 11, this contains a single calcification and is slightly hyperdense versus brain parenchyma, favor meningioma  MRI Head 3/12 > Extra-axial mass on the underside of left tentorium cerebelli near petroclival ligament with broad dural base and tail likely representing meningoma, mild mass effect on the left trigeminal nerve in the pre pontine cistern  CT Head 3/12 > No acute, Stable left tentorial probable meningioma   CULTURES: Blood 3/12 > Sputum 3/12 > Urine 3/12 >  ANTIBIOTICS: Vancomycin 3/12 > Zosyn 3/12 >   SIGNIFICANT EVENTS: 3/12 > Presents to ED   LINES/TUBES: ETT 3/12 >>  DISCUSSION: 83 year old female with reported 6 months of confusion and syncope. Presents to ED on 3/12 with syncope, once in the ED while walking to bathroom with staff member patient fell and was found pulseless. Reported to have coded 4 times each about 4-5 minutes long for a total of 18 minutes. Patient  decided to make patient a DNR and are discussing time for withdrawal as it is known that patient would have not wanted extensive measures.   ASSESSMENT / PLAN:  PULMONARY A: Acute Respiratory Failure +/- CAP vs Aspiration vs HCAP P:   Vent Support ABG now  Trend CXR in am  Maintain Saturation >92   CARDIOVASCULAR A:  Cardiac Arrest  New RBBB P:  Cardiology Consulted - Family favors non-aggressive approach  Heparin GTT  Cardiac Monitoring  Maintain MAP >65  Trend Troponin   RENAL A:   No issues  P:   Trend BMP Replace Electrolytes as needed   GASTROINTESTINAL A:   Dysphagia  P:   NPO PPI   HEMATOLOGIC A:   No issues  P:  Trend CBC   INFECTIOUS A:   Sepsis secondary to CAP vs Aspiration vs HCAP P:   PAN culture  Trend Fever and WBC curve  Trend Procal and Lactic Acid  Continue Vancomycin and Zosyn  Low Thresh-hold for D/C antibiotics   ENDOCRINE A:   No issues  P:   Trend Glucose   NEUROLOGIC A:   Encephalopathy secondary to Anoxic Injury vs Meningioma  P:   RASS goal: 0 EEG  Wean Propofol (Began for Dyssynchrony)     FAMILY  - Updates: family updated at beside, expressed that patient would not want to be on ventilator however they are not currently ready to withdrawal, will call other family members in   - Inter-disciplinary family meet or Palliative Care meeting due by:  Ongoing    Hayden Pedro, AG-ACNP [facility]  Pgr: [phone]  PCCM Pgr: [phone]

## 2016-06-18 NOTE — Code Documentation (Signed)
Dr. Maryan Rued at bedside in [place].

## 2016-06-18 NOTE — ED Notes (Addendum)
Family states they want to withdraw care in the ED. Christian in  ICU contacted and Houston Siren, NP informed. Family remains at bedside. And spntaneous movement of right hand noted.x 1

## 2016-06-18 NOTE — ED Notes (Signed)
Per [place], [place], family has made the decision to withdraw care at this time.  Orders will be changed to reflect new plan.

## 2016-06-18 NOTE — ED Notes (Signed)
Attempted to call report

## 2016-06-18 NOTE — ED Notes (Signed)
Norepi restarted at left [facility]. For bp of 84 systolic.

## 2016-06-18 NOTE — Progress Notes (Signed)
RT has received the orders for withdrawal and verbal order to turn off vent due to patient expired. ETT left in per MD verbal order.

## 2016-06-18 NOTE — ED Notes (Signed)
Patient returns from CT

## 2016-06-18 NOTE — Assessment & Plan Note (Signed)
Possible pneumonia based on season, age, presentation and brownish sputum via et tube Aspiration v HCAP v CAP  Plan Sepsis biormarker Broad antibioptics

## 2016-06-18 NOTE — Assessment & Plan Note (Signed)
Unclear baseline  Plan Ascertain fropm family

## 2016-06-18 NOTE — ED Provider Notes (Signed)
Sherry Watts Provider Note   CSN: [id number] Arrival date & time: 2016/06/04  3329     History   Chief Complaint Chief Complaint  Patient presents with  . Shortness of Breath    Pt having some SOB as per family yesterday than this morning pt woke up as per family had some slurred speech that resolved and dizziness     HPI Sherry Watts is a 83 y.o. female.  Patient is a 83 year old female without significant medical history presenting today by EMS for several complaints. Family member accompanies the patient and gives most of the history. She has been short of breath in the last 2 days with exertion which family member states is new. Patient does live independently but family member was here 15 days ago and she is not having any difficulty breathing with walking at that time. Also she has noticed a staggering gait and patient complains of dizziness. Patient states it's an unsteady feeling well and feels like it's difficult to walk. Today family member states that patient stood up and was having difficulty walking. She then saw her fall back down onto the bed with drooping of the right side of her face and slurred speech. She became flushed and after lying down that resolved within a matter of minutes. Patient states she has a tendency to pass out but did not feel like she would pass out today but has complained of dizziness with walking but is unclear how long that has been going on. She denies any shortness of breath or chest pain at this time. On this patient has been having chest pain as she minimizes most of her symptoms. She denies abdominal pain or urinary symptoms. She currently only takes vitamins and no prescription medications. Speaking with her family member separately over the last 6-8 months patient has had a steady decline in her ability to care for herself. She currently is living independently but family is concerned that she should not be cooking for herself that she could  possibly bring the building down. She has been riding the bus sometimes all day long and they're not sure where she is going. She forgets where her car keys or purse are. She is having difficulty managing her finances. Family has discussed this with social work and was planning on visiting her doctor because they feel she needs to be in a memory care unit. Patient currently is having difficult time accepting this.   The history is provided by the patient and a relative.  Shortness of Breath     Past Medical History:  Diagnosis Date  . Allergy   . Arthritis   . Hyperlipidemia     Patient Active Problem List   Diagnosis Date Noted  . Alzheimer's disease 01/03/2016  . Vitamin D deficiency 01/06/2015  . Routine general medical examination at a health care facility 01/05/2014  . Essential hypertension 12/06/2012  . Osteoarthritis 01/07/2007  . Hyperlipidemia 11/12/2006    Past Surgical History:  Procedure Laterality Date  . APPENDECTOMY    . EXPLORATORY LAPAROTOMY    . foot fx     . TONSILLECTOMY      OB History    No data available       Home Medications    Prior to Admission medications   Medication Sig Start Date End Date Taking? Authorizing Provider  meloxicam (MOBIC) 15 MG tablet TAKE 1 TABLET BY MOUTH   EVERY DAY Patient not taking: Reported on 06-04-16 01/03/16  Hoyt Koch, MD    Family History Family History  Problem Relation Age of Onset  . Heart disease Mother   . Hypertension Mother   . Heart attack    . Diabetes Daughter     Social History Social History  Substance Use Topics  . Smoking status: Never Smoker  . Smokeless tobacco: Never Used  . Alcohol use No     Allergies   Patient has no known allergies.   Review of Systems Review of Systems  Respiratory: Positive for shortness of breath.   All other systems reviewed and are negative.    Physical Exam Updated Vital Signs BP 114/91 (BP Location: Right Arm)   Pulse 101    Temp 98.1 F (36.7 C) (Oral)   Resp 23   Ht 5\' 4"  (1.626 m)   Wt 145 lb (65.8 kg)   SpO2 94%   BMI 24.89 kg/m   Physical Exam  Constitutional: She appears well-developed and well-nourished. No distress.  HENT:  Head: Normocephalic and atraumatic.  Mouth/Throat: Oropharynx is clear and moist.  Eyes: Conjunctivae and EOM are normal. Pupils are equal, round, and reactive to light.  Neck: Normal range of motion. Neck supple.  Cardiovascular: Regular rhythm and intact distal pulses.  Tachycardia present.   No murmur heard. Pulmonary/Chest: Effort normal and breath sounds normal. No respiratory distress. She has no wheezes. She has no rales.  Abdominal: Soft. She exhibits no distension. There is no tenderness. There is no rebound and no guarding.  Musculoskeletal: Normal range of motion. She exhibits no edema or tenderness.  Neurological: She is alert. She has normal strength. No cranial nerve deficit or sensory deficit. Coordination normal.  Oriented to person or place but not sure what day of the week it is.  5 out of 5 strength in all 4 extremities. Sensation intact in all 4 extremities. Normal heel to shin bilaterally. Normal finger to nose bilaterally. No visual field deficits.  Did not stand or walk patient  Skin: Skin is warm and dry. No rash noted. No erythema.  Psychiatric: She has a normal mood and affect. Her behavior is normal.  Nursing note and vitals reviewed.    ED Treatments / Results  Labs (all labs ordered are listed, but only abnormal results are displayed) Labs Reviewed  CBC WITH DIFFERENTIAL/PLATELET - Abnormal; Notable for the following:       Result Value   Platelets 120 (*)    All other components within normal limits  COMPREHENSIVE METABOLIC PANEL - Abnormal; Notable for the following:    Glucose, Bld 108 (*)    BUN 21 (*)    ALT 12 (*)    GFR calc non Af Amer 47 (*)    GFR calc Af Amer 55 (*)    All other components within normal limits  BRAIN  NATRIURETIC PEPTIDE - Abnormal; Notable for the following:    B Natriuretic Peptide 150.7 (*)    All other components within normal limits  CBG MONITORING, ED - Abnormal; Notable for the following:    Glucose-Capillary 105 (*)    All other components within normal limits  URINALYSIS, ROUTINE W REFLEX MICROSCOPIC  TROPONIN I  TROPONIN I  TROPONIN I  LACTIC ACID, PLASMA  LACTIC ACID, PLASMA  BLOOD GAS, ARTERIAL  I-STAT TROPOININ, ED    EKG  EKG Interpretation  Date/Time:  2016-06-26 09:06:12 EDT Ventricular Rate:  99 PR Interval:    QRS Duration: 88 QT Interval:  335  QTC Calculation: 430 R Axis:   70 Text Interpretation:  Sinus rhythm Minimal ST depression, inferior leads No significant change since last tracing Confirmed by Maryan Rued  MD, Lamiah Marmol ([id number]) on May 31, 2016 9:55:31 AM       Radiology Dg Chest 2 View  Result Date: 31-May-2016 CLINICAL DATA:  Dizziness and slurred speech EXAM: CHEST  2 VIEW COMPARISON:  December 03, 2009 FINDINGS: There is mild scarring in the right upper lobe. There is also mild scarring in the left base. Lungs elsewhere clear. Heart size and pulmonary vascularity are normal. No adenopathy. There is atherosclerotic calcification in the aorta. No bone lesions. IMPRESSION: Mild scarring right upper lobe and left base. No edema or consolidation. Aortic atherosclerosis. Electronically Signed   By: Sherry Grip III M.D.   On: 05-31-2016 10:29   Ct Head Wo Contrast  Result Date: 05/31/16 CLINICAL DATA:  Unresponsive.  Fall. EXAM: CT HEAD WITHOUT CONTRAST TECHNIQUE: Contiguous axial images were obtained from the base of the skull through the vertex without intravenous contrast. COMPARISON:  Brain MR obtained earlier today. Head CT obtained earlier today. FINDINGS: Brain: Diffusely enlarged ventricles and subarachnoid spaces. Patchy white matter low density in both cerebral hemispheres. Stable previously described minute calcified extra-axial  mass along the underside of the left tentorium. No intracranial hemorrhage or mass effect. Vascular: No hyperdense vessel or unexpected calcification. Skull: Normal. Negative for fracture or focal lesion. Sinuses/Orbits: No acute finding. Other: None. IMPRESSION: 1. No acute abnormality. 2. Stable left tentorial probable meningioma. 3. Stable atrophy and chronic small vessel white matter ischemic changes. Electronically Signed   By: Claudie Revering M.D.   On: May 31, 2016 16:20   Ct Head Wo Contrast  Result Date: 05/31/2016 CLINICAL DATA:  Dizziness, unsteady gait, onset of symptoms upon awakening this morning EXAM: CT HEAD WITHOUT CONTRAST TECHNIQUE: Contiguous axial images were obtained from the base of the skull through the vertex without intravenous contrast. COMPARISON:  None FINDINGS: Brain: Asymmetric positioning in gantry. Generalized atrophy. Normal ventricular morphology. No midline shift or mass effect. Small vessel chronic ischemic changes of deep cerebral white matter. Small soft tissue mass LEFT lateral to the pons, 14 x 11 x 11 mm in size, above the internal auditory canal, slightly hyperdense and likely containing a small calcification. No additional mass lesion, intracranial hemorrhage, or evidence acute infarction. No extra-axial fluid collections. Vascular: Few atherosclerotic calcifications at the carotid siphons Skull: Intact Sinuses/Orbits: Clear Other: N/A IMPRESSION: Atrophy with small vessel chronic ischemic changes of deep cerebral white matter. Extra-axial mass at LEFT lateral aspect of pons 14 x 11 x 11 mm; this contains a single calcification and is slightly hyperdense versus brain parenchyma. Favor meningioma though tumor arising from cranial nerve or unlikely aneurysm not excluded. Further assessment by MR imaging of the brain with/without contrast recommended to localize and characterize. Findings called to Dr. Maryan Watts On May 31, 2016 at 1110 hours. Electronically Signed   By: Sherry Watts M.D.   On: 05-31-2016 11:12   Mr Sherry Watts And Wo Contrast  Result Date: 2016/05/31 CLINICAL DATA:  83 y/o  F; intracranial mass for further evaluation. EXAM: MRI HEAD WITHOUT AND WITH CONTRAST TECHNIQUE: Multiplanar, multiecho pulse sequences of the brain and surrounding structures were obtained without and with intravenous contrast. CONTRAST:  52mL MULTIHANCE GADOBENATE DIMEGLUMINE 529 MG/ML IV SOLN COMPARISON:  05-31-16 CT of the head. FINDINGS: Brain: No acute infarction, hemorrhage, hydrocephalus, extra-axial collection or mass lesion. Nonspecific foci of T2 FLAIR hyperintense signal abnormality in subcortical and periventricular  white matter are compatible with moderate chronic microvascular ischemic changes. Mild brain parenchymal volume loss. No abnormal enhancement of the brain. Extra-axial mass centered on the underside of the left tentorium cerebelli near the petroclival ligament just superior to inter auditory canal and exerting mass effect on left trigeminal nerve complex in the pre pontine cistern (series 11, image 14). The mass has a broad dural base and dural tail measuring 16 x 9 mm axially (series 12, image 12). Vascular: Normal flow voids. Skull and upper cervical spine: Normal marrow signal. Sinuses/Orbits: Ethmoid and right frontal sinus mucosal thickening. No abnormal signal of mastoid air cells. Bilateral intra-ocular lens replacement. None Other: None. IMPRESSION: 1. Extra-axial mass on the underside of left tentorium cerebelli near petroclival ligament with broad dural base and tail likely representing meningioma. Mild mass effect on the left trigeminal nerve in the pre pontine cistern. 2. No acute intracranial abnormality. 3. Moderate chronic microvascular ischemic changes and mild parenchymal volume loss of the brain. 4. Mild paranasal sinus disease. Electronically Signed   By: Sherry Watts M.D.   On: Jun 03, 2016 14:24   Dg Chest Portable 1 View  Result Date:  06/03/2016 CLINICAL DATA:  Cardiac arrest.  Intubation. EXAM: PORTABLE CHEST 1 VIEW 4:12 p.m. COMPARISON:  06/03/2016 at 10:08 a.m. FINDINGS: Endotracheal tube is 2 cm above the carina. Heart size and pulmonary vascularity are normal and the lungs are clear. IMPRESSION: Endotracheal tube in good position 2 cm above the carina. Clear lungs. Electronically Signed   By: Lorriane Shire M.D.   On: 2016/06/03 16:25    Procedures Procedures (including critical care time)  Medications Ordered in ED Medications  atropine injection (1 mg Intravenous Given 03-Jun-2016 1509)  0.9 %  sodium chloride infusion (1,000 mLs Intravenous New Bag/Given 2016/06/03 1513)  norepinephrine (LEVOPHED) 4 mg in dextrose 5 % 250 mL (0.016 mg/mL) infusion (20 mcg/min Intravenous Rate/Dose Change 2016/06/03 1608)  heparin ADULT infusion 100 units/mL (25000 units/258mL sodium chloride 0.45%) (800 Units/hr Intravenous New Bag/Given 06/03/16 1648)  0.9 %  sodium chloride infusion (not administered)  pantoprazole (PROTONIX) injection 40 mg (not administered)  gadobenate dimeglumine (MULTIHANCE) injection 10 mL (10 mLs Intravenous Contrast Given 06-03-16 1415)  heparin bolus via infusion 3,500 Units (3,500 Units Intravenous Bolus from Bag June 03, 2016 1648)     Initial Impression / Assessment and Plan / ED Course  I have reviewed the triage vital signs and the nursing notes.  Pertinent labs & imaging results that were available during my care of the patient were reviewed by me and considered in my medical decision making (see chart for details).     Patient is an elderly female who is having difficulty walking, dizziness, presumably new shortness of breath as well as a study gradual decline and inability to care for herself per family. On exam patient is tachycardic but displays no sign of fluid overload and no strokelike symptoms at this time. Based on description by family difficult to discern if patient had a near syncopal event today  versus potential TIA. However symptoms seem to review his all very quickly once she laid down. Feel TIA is less likely patient states she has been having dizziness and difficulty walking but is unclear how long. Family patient is not caring for herself including not bathing herself not fixing meals for herself and wandering. They're very concerned for her. We'll evaluate for any signs of fluid overload, ACS, UTI, new acute brain pathology.   4:52 PM Labs without significant findings. CT with  concern for mass but MRI showed a meningioma. Patient got up to go to the bathroom. In the bathroom unattended she had a syncopal event and was Watts on the floor. Patient was breathing with a pulse but was nonresponsive. Patient was then taken back to her room where she began breathing agonally and became bradycardic and then lost pulses. The patient was intubated and CPR was started. Her niece was present at bedside and initially gave the go ahead to do full resuscitation. Repeat EKG showed a new right bundle branch block and concern for inferior elevation. After 3 rounds of epi and CPR patient regained pulses. On return of circulation no significant ST elevation but new right bundle branch block. Dr. Gwenlyn Watts with cardiology evaluated. He came to family the option of a catheterization but at this time they were not sure she would once.. At this time they are requesting heparin and time to see if patient regained consciousness and can be taken off the ventilator. Because patient hit her head when she fell in the bathroom a repeat head CT was done without signs of bleeding. Heparin was then started. Patient did lose pulses while in the CAT scanner and received 1 amp of epi with return of circulation. She is currently on a norepinephrine drip at 20. Blood pressure and map remain good. Last map was 85. Critical care to admit. Long discussion with family and they decided if she loses pulses again that patient would not 1 to be  resuscitated. This was communicated with critical care.  CRITICAL CARE Performed by: Sherry Watts Total critical care time: 50 minutes Critical care time was exclusive of separately billable procedures and treating other patients. Critical care was necessary to treat or prevent imminent or life-threatening deterioration. Critical care was time spent personally by me on the following activities: development of treatment plan with patient and/or surrogate as well as nursing, discussions with consultants, evaluation of patient's response to treatment, examination of patient, obtaining history from patient or surrogate, ordering and performing treatments and interventions, ordering and review of laboratory studies, ordering and review of radiographic studies, pulse oximetry and re-evaluation of patient's condition.  INTUBATION Performed by: Sherry Watts  Required items: required blood products, implants, devices, and special equipment available Patient identity confirmed: provided demographic data and hospital-assigned identification number Time out: Immediately prior to procedure a "time out" was called to verify the correct patient, procedure, equipment, support staff and site/side marked as required.  Indications: syncope, apnea, hypoxia  Intubation method: 4Glidescope Laryngoscopy   Preoxygenation: BVM  Sedatives: none Paralytic: none Tube Size: 7.5 cuffed  Post-procedure assessment: chest rise and ETCO2 monitor Breath sounds: equal and absent over the epigastrium Tube secured with: ETT holder Chest x-ray interpreted by radiologist and me.  Chest x-ray findings: endotracheal tube in appropriate position  Patient tolerated the procedure well with no immediate complications.    Final Clinical Impressions(s) / ED Diagnoses   Final diagnoses:  Syncope, unspecified syncope type  ST elevation myocardial infarction (STEMI) involving other coronary artery of inferior wall  [facility])  Cardiac arrest [facility])    New Prescriptions New Prescriptions   No medications on file     Sherry Dessert, MD 06-15-2016 1656

## 2016-06-18 NOTE — ED Notes (Signed)
Patient transported to CT 

## 2016-06-18 NOTE — ED Notes (Signed)
Aspirin 300mg  per rectum

## 2016-06-18 NOTE — Code Documentation (Signed)
Pt suctioned by RTY. Frothy white secrestions noted

## 2016-06-18 NOTE — Progress Notes (Signed)
PCCM Interval Progress Note  Asked to come to bedside to meet with family who were wishing for withdrawal of care / full comfort measures now while pt is in ED and not put her through transfer up to ICU, etc.  Met with multiple family members at bedside and discussed events leading to and following initial cardiac arrest.  We had extensive discussions regarding current circumstances, organ failures, and grim prognosis. We also discussed patient's prior wishes under circumstances such as this. The family has decided to offer full comfort care for Sherry Watts and not transfer her upstairs or put her through additional labs, procedures, imaging, etc.  Withdrawal orders placed and nursing staff in ED updated on plan of care.   Montey Hora, [facility] Pager: [phone]  or [phone] 2016-06-07, 7:41 PM

## 2016-06-18 NOTE — ED Notes (Signed)
Pt found face down in floor of bathroom. With both legs on side of toilet. Maintaining cspine. Pt logrolled to bb and placed on stretcher with dr. Maryan Rued at bedside. Pt noted as unresponsive to verbal or stimuli. REspirations noted as decreased.

## 2016-06-18 NOTE — Consult Note (Signed)
Reason for Consult:   Cardiac arrest  Requesting Physician: ED Primary Cardiologist Dr Gwenlyn Found (new)  HPI:   83 y/o AA female with no history of CAD or MI. She has previously been treated for HTN and HLD but not in the past year. She lives alone in a senior apartment complex. Dr Sharlet Salina saw her in Oct. There reportedly has been some memory issues noted but Dr Sharlet Salina notes the pt is able to take the bus to the grocery store. She was brought to the ED by her niece after the pt had some dizziness at home. The family member has noticed a staggering gait and patient complains of dizziness. In the ED the patient states it's an unsteady feeling well and feels like it's difficult to walk. Today family member states that patient stood up and was having difficulty walking. She then saw her fall back down onto the bed with drooping of the right side of her face and slurred speech. She became flushed and after lying down that resolved within a matter of minutes. Patient states she has noted dizziness with walking but is unclear how long that has been going on. No chest pain, some DOE noted. In the ED the pt was initially asymptomatic. She went to the BR and collapsed. She received CPR x 5 minutes. Her rhythm was "agonal" then RBBB, new for her. She was intubated in the ED. Her B/P is stable. She is not responsive. Dr Gwenlyn Found is present and discussed the situation with the family and the decision was made to not pursue aggressive treatment.   PMHx:  Past Medical History:  Diagnosis Date  . Allergy   . Arthritis   . Hyperlipidemia     Past Surgical History:  Procedure Laterality Date  . APPENDECTOMY    . EXPLORATORY LAPAROTOMY    . foot fx     . TONSILLECTOMY      SOCHx:  reports that she has never smoked. She has never used smokeless tobacco. She reports that she does not drink alcohol or use drugs.  FAMHx: Family History  Problem Relation Age of Onset  . Heart disease Mother   .  Hypertension Mother   . Heart attack    . Diabetes Daughter     ALLERGIES: No Known Allergies  ROS: Review of Systems: Unobtainable as pt is intubated and unresponsive. Per family present- no serious medical problems, no home meds, no recent chest pain. Some DOE noted.    HOME MEDICATIONS: Prior to Admission medications   Medication Sig Start Date End Date Taking? Authorizing Provider  meloxicam (MOBIC) 15 MG tablet TAKE 1 TABLET BY MOUTH   EVERY DAY Patient not taking: Reported on 2016/06/08 01/03/16   Hoyt Koch, MD    HOSPITAL MEDICATIONS: I have reviewed the patient's current medications.  VITALS: Blood pressure 122/57, pulse 78, temperature 98.1 F (36.7 C), temperature source Oral, resp. rate 20, height 5\' 4"  (1.626 m), weight 145 lb (65.8 kg), SpO2 96 %.  PHYSICAL EXAM: General appearance: intubated, unresponsive Neck: no JVD Lungs: clear to auscultation bilaterally Heart: regular rate and rhythm Abdomen: soft, non-tender; bowel sounds normal; no masses,  no organomegaly Extremities: no edema Pulses: 2+ and symmetric Skin: Skin color, texture, turgor normal. No rashes or lesions Neurologic: Grossly normal-unresponsive- not sedated  LABS: Results for orders placed or performed during the hospital encounter of 08-Jun-2016 (from the past 24 hour(s))  CBC with Differential/Platelet  Status: Abnormal   Collection Time: 2016/06/01 10:08 AM  Result Value Ref Range   WBC 9.4 4.0 - 10.5 K/uL   RBC 4.49 3.87 - 5.11 MIL/uL   Hemoglobin 13.8 12.0 - 15.0 g/dL   HCT 42.9 36.0 - 46.0 %   MCV 95.5 78.0 - 100.0 fL   MCH 30.7 26.0 - 34.0 pg   MCHC 32.2 30.0 - 36.0 g/dL   RDW 14.3 11.5 - 15.5 %   Platelets 120 (L) 150 - 400 K/uL   Neutrophils Relative % 75 %   Neutro Abs 7.0 1.7 - 7.7 K/uL   Lymphocytes Relative 16 %   Lymphs Abs 1.5 0.7 - 4.0 K/uL   Monocytes Relative 8 %   Monocytes Absolute 0.8 0.1 - 1.0 K/uL   Eosinophils Relative 1 %   Eosinophils Absolute  0.1 0.0 - 0.7 K/uL   Basophils Relative 0 %   Basophils Absolute 0.0 0.0 - 0.1 K/uL  Comprehensive metabolic panel     Status: Abnormal   Collection Time: 01-Jun-2016 10:08 AM  Result Value Ref Range   Sodium 142 135 - 145 mmol/L   Potassium 4.1 3.5 - 5.1 mmol/L   Chloride 109 101 - 111 mmol/L   CO2 24 22 - 32 mmol/L   Glucose, Bld 108 (H) 65 - 99 mg/dL   BUN 21 (H) 6 - 20 mg/dL   Creatinine, Ser 1.00 0.44 - 1.00 mg/dL   Calcium 9.0 8.9 - 10.3 mg/dL   Total Protein 7.5 6.5 - 8.1 g/dL   Albumin 4.0 3.5 - 5.0 g/dL   AST 23 15 - 41 U/L   ALT 12 (L) 14 - 54 U/L   Alkaline Phosphatase 83 38 - 126 U/L   Total Bilirubin 0.8 0.3 - 1.2 mg/dL   GFR calc non Af Amer 47 (L) >60 mL/min   GFR calc Af Amer 55 (L) >60 mL/min   Anion gap 9 5 - 15  Brain natriuretic peptide     Status: Abnormal   Collection Time: 2016/06/01 10:08 AM  Result Value Ref Range   B Natriuretic Peptide 150.7 (H) 0.0 - 100.0 pg/mL  I-stat troponin, ED     Status: None   Collection Time: 06/01/2016 10:16 AM  Result Value Ref Range   Troponin i, poc 0.04 0.00 - 0.08 ng/mL   Comment 3          CBG monitoring, ED     Status: Abnormal   Collection Time: 06/01/16  3:01 PM  Result Value Ref Range   Glucose-Capillary 105 (H) 65 - 99 mg/dL    EKG: Initial EKG NSR           Post arrest EKG- NSR- new RBBB  IMAGING: Dg Chest 2 View  Result Date: 06/01/16 CLINICAL DATA:  Dizziness and slurred speech EXAM: CHEST  2 VIEW COMPARISON:  December 03, 2009 FINDINGS: There is mild scarring in the right upper lobe. There is also mild scarring in the left base. Lungs elsewhere clear. Heart size and pulmonary vascularity are normal. No adenopathy. There is atherosclerotic calcification in the aorta. No bone lesions. IMPRESSION: Mild scarring right upper lobe and left base. No edema or consolidation. Aortic atherosclerosis. Electronically Signed   By: Lowella Grip III M.D.   On: June 01, 2016 10:29   Ct Head Wo Contrast  Result Date:  2016/06/01 CLINICAL DATA:  Dizziness, unsteady gait, onset of symptoms upon awakening this morning EXAM: CT HEAD WITHOUT CONTRAST TECHNIQUE: Contiguous axial images were obtained  from the base of the skull through the vertex without intravenous contrast. COMPARISON:  None FINDINGS: Brain: Asymmetric positioning in gantry. Generalized atrophy. Normal ventricular morphology. No midline shift or mass effect. Small vessel chronic ischemic changes of deep cerebral white matter. Small soft tissue mass LEFT lateral to the pons, 14 x 11 x 11 mm in size, above the internal auditory canal, slightly hyperdense and likely containing a small calcification. No additional mass lesion, intracranial hemorrhage, or evidence acute infarction. No extra-axial fluid collections. Vascular: Few atherosclerotic calcifications at the carotid siphons Skull: Intact Sinuses/Orbits: Clear Other: N/A IMPRESSION: Atrophy with small vessel chronic ischemic changes of deep cerebral white matter. Extra-axial mass at LEFT lateral aspect of pons 14 x 11 x 11 mm; this contains a single calcification and is slightly hyperdense versus brain parenchyma. Favor meningioma though tumor arising from cranial nerve or unlikely aneurysm not excluded. Further assessment by MR imaging of the brain with/without contrast recommended to localize and characterize. Findings called to Dr. Maryan Rued On June 10, 2016 at 1110 hours. Electronically Signed   By: Lavonia Dana M.D.   On: 06/10/2016 11:12   Mr Jeri Cos And Wo Contrast  Result Date: 2016/06/10 CLINICAL DATA:  83 y/o  F; intracranial mass for further evaluation. EXAM: MRI HEAD WITHOUT AND WITH CONTRAST TECHNIQUE: Multiplanar, multiecho pulse sequences of the brain and surrounding structures were obtained without and with intravenous contrast. CONTRAST:  57mL MULTIHANCE GADOBENATE DIMEGLUMINE 529 MG/ML IV SOLN COMPARISON:  2016-06-10 CT of the head. FINDINGS: Brain: No acute infarction, hemorrhage, hydrocephalus,  extra-axial collection or mass lesion. Nonspecific foci of T2 FLAIR hyperintense signal abnormality in subcortical and periventricular white matter are compatible with moderate chronic microvascular ischemic changes. Mild brain parenchymal volume loss. No abnormal enhancement of the brain. Extra-axial mass centered on the underside of the left tentorium cerebelli near the petroclival ligament just superior to inter auditory canal and exerting mass effect on left trigeminal nerve complex in the pre pontine cistern (series 11, image 14). The mass has a broad dural base and dural tail measuring 16 x 9 mm axially (series 12, image 12). Vascular: Normal flow voids. Skull and upper cervical spine: Normal marrow signal. Sinuses/Orbits: Ethmoid and right frontal sinus mucosal thickening. No abnormal signal of mastoid air cells. Bilateral intra-ocular lens replacement. None Other: None. IMPRESSION: 1. Extra-axial mass on the underside of left tentorium cerebelli near petroclival ligament with broad dural base and tail likely representing meningioma. Mild mass effect on the left trigeminal nerve in the pre pontine cistern. 2. No acute intracranial abnormality. 3. Moderate chronic microvascular ischemic changes and mild parenchymal volume loss of the brain. 4. Mild paranasal sinus disease. Electronically Signed   By: Kristine Garbe M.D.   On: 10-Jun-2016 14:24    IMPRESSION: Principal Problem:   Cardiac arrest [facility]) Active Problems:   Osteoarthritis   RBBB-new-possible STEMI   Memory difficulty- possible early dementia   RECOMMENDATION: After discussion between Dr Gwenlyn Found and the family it was decided to not pursue aggresive Rx-ie; cath. CCM to see. A repeat CT is being ordered in ED- ? CVA not seen on initial  CT scan.   Time Spent Directly with Patient: 43 minutes  Kerin Ransom, [place] beeper June 10, 2016, 3:51 PM   Agree with note written by Kerin Ransom PAC  83 year old frail appearing  African-American female who lives in a independent living home was brought to the ER for complaints of shortness of breath. She has no prior cardiac history. Her family is  present. She does have a history of hypertension and hyperlipidemia not currently being treated. She had a cardiac arrest while in the bathroom and fell from the toilet to the floor. She was brought back to her room. Her initial EKG showed normal sinus rhythm without ST or T-wave changes and she was brought to the emergency room and her EKG initially post arrest did show a new right bundle branch block with some ST T wave changes concerning for STEMI. She had PTA, CPR and appropriate medications with return of blood pressure. She was intubated. Her exam otherwise was benign. Her ST segment changes resolved once her heart rate slowed down. She was placed on Neo-Synephrine. After discussion with her daughter and other family members was decided not to pursue an aggressive approach i.e. cardiac catheterization rather to allow her to it over this event, we will hopefully get extubated and then to decide whether or not to pursue a more aggressive/invasive approach.  Quay Burow 06/09/16 4:29 PM

## 2016-06-18 NOTE — ED Notes (Signed)
Attempting lab draw.  

## 2016-06-18 NOTE — ED Notes (Signed)
Pt noted to have bruising.at right eyebrow,The patient is advised to apply ice or cold packs intermittently as needed to relieve pain. Applied.

## 2016-06-18 NOTE — ED Notes (Addendum)
CCM RAul, PA at bedside REport given aware that labs have been attempted without success. States to hold vanc and zosyn at this time.

## 2016-06-18 NOTE — Code Documentation (Signed)
Pt to ct.with rn and monitor and techa dn pharmacy.

## 2016-06-18 NOTE — Code Documentation (Signed)
Return from CT

## 2016-06-18 NOTE — ED Notes (Signed)
Patient transported to MRI 

## 2016-06-18 NOTE — H&P (Signed)
STAFF NOTE: I, Dr Ann Lions have personally reviewed patient's available data, including medical history, events of note, physical examination and test results as part of my evaluation. I have discussed with resident/NP and other care providers such as pharmacist, RN and RRT.  In addition,  I personally evaluated patient and elicited key findings of   S:  83 year old female with memory issues nos and possible stroke presentation and then PEA intermittently. Comatose but over breathing on vent  O: comatose Has brownish sputum via et tube Tachypneic, breathes over vent,    PULMONARY No results for input(s): PHART, PCO2ART, PO2ART, HCO3, TCO2, O2SAT in the last 168 hours.  Invalid input(s): PCO2, PO2  CBC  Recent Labs Lab 2016-06-28 1008  HGB 13.8  HCT 42.9  WBC 9.4  PLT 120*    COAGULATION No results for input(s): INR in the last 168 hours.  CARDIAC  No results for input(s): TROPONINI in the last 168 hours. No results for input(s): PROBNP in the last 168 hours.   CHEMISTRY  Recent Labs Lab 28-Jun-2016 1008  NA 142  K 4.1  CL 109  CO2 24  GLUCOSE 108*  BUN 21*  CREATININE 1.00  CALCIUM 9.0   Estimated Creatinine Clearance: 33.5 mL/min (by C-G formula based on SCr of 1 mg/dL).   LIVER  Recent Labs Lab 2016/06/28 1008  AST 23  ALT 12*  ALKPHOS 83  BILITOT 0.8  PROT 7.5  ALBUMIN 4.0     INFECTIOUS No results for input(s): LATICACIDVEN, PROCALCITON in the last 168 hours.   ENDOCRINE CBG (last 3)   Recent Labs  06-28-2016 1501  GLUCAP 105*         IMAGING x48h  - image(s) personally visualized  -   highlighted in bold Dg Chest 2 View  Result Date: Jun 28, 2016 CLINICAL DATA:  Dizziness and slurred speech EXAM: CHEST  2 VIEW COMPARISON:  December 03, 2009 FINDINGS: There is mild scarring in the right upper lobe. There is also mild scarring in the left base. Lungs elsewhere clear. Heart size and pulmonary vascularity are normal. No adenopathy. There  is atherosclerotic calcification in the aorta. No bone lesions. IMPRESSION: Mild scarring right upper lobe and left base. No edema or consolidation. Aortic atherosclerosis. Electronically Signed   By: Lowella Grip III M.D.   On: 06-28-2016 10:29   Ct Head Wo Contrast  Result Date: 2016-06-28 CLINICAL DATA:  Unresponsive.  Fall. EXAM: CT HEAD WITHOUT CONTRAST TECHNIQUE: Contiguous axial images were obtained from the base of the skull through the vertex without intravenous contrast. COMPARISON:  Brain MR obtained earlier today. Head CT obtained earlier today. FINDINGS: Brain: Diffusely enlarged ventricles and subarachnoid spaces. Patchy white matter low density in both cerebral hemispheres. Stable previously described minute calcified extra-axial mass along the underside of the left tentorium. No intracranial hemorrhage or mass effect. Vascular: No hyperdense vessel or unexpected calcification. Skull: Normal. Negative for fracture or focal lesion. Sinuses/Orbits: No acute finding. Other: None. IMPRESSION: 1. No acute abnormality. 2. Stable left tentorial probable meningioma. 3. Stable atrophy and chronic small vessel white matter ischemic changes. Electronically Signed   By: Claudie Revering M.D.   On: Jun 28, 2016 16:20   Ct Head Wo Contrast  Result Date: 2016/06/28 CLINICAL DATA:  Dizziness, unsteady gait, onset of symptoms upon awakening this morning EXAM: CT HEAD WITHOUT CONTRAST TECHNIQUE: Contiguous axial images were obtained from the base of the skull through the vertex without intravenous contrast. COMPARISON:  None FINDINGS: Brain: Asymmetric  positioning in gantry. Generalized atrophy. Normal ventricular morphology. No midline shift or mass effect. Small vessel chronic ischemic changes of deep cerebral white matter. Small soft tissue mass LEFT lateral to the pons, 14 x 11 x 11 mm in size, above the internal auditory canal, slightly hyperdense and likely containing a small calcification. No additional  mass lesion, intracranial hemorrhage, or evidence acute infarction. No extra-axial fluid collections. Vascular: Few atherosclerotic calcifications at the carotid siphons Skull: Intact Sinuses/Orbits: Clear Other: N/A IMPRESSION: Atrophy with small vessel chronic ischemic changes of deep cerebral white matter. Extra-axial mass at LEFT lateral aspect of pons 14 x 11 x 11 mm; this contains a single calcification and is slightly hyperdense versus brain parenchyma. Favor meningioma though tumor arising from cranial nerve or unlikely aneurysm not excluded. Further assessment by MR imaging of the brain with/without contrast recommended to localize and characterize. Findings called to Dr. Maryan Rued On 06/06/16 at 1110 hours. Electronically Signed   By: Lavonia Dana M.D.   On: June 06, 2016 11:12   Mr Jeri Cos And Wo Contrast  Result Date: 06/06/2016 CLINICAL DATA:  83 y/o  F; intracranial mass for further evaluation. EXAM: MRI HEAD WITHOUT AND WITH CONTRAST TECHNIQUE: Multiplanar, multiecho pulse sequences of the brain and surrounding structures were obtained without and with intravenous contrast. CONTRAST:  39mL MULTIHANCE GADOBENATE DIMEGLUMINE 529 MG/ML IV SOLN COMPARISON:  06/06/2016 CT of the head. FINDINGS: Brain: No acute infarction, hemorrhage, hydrocephalus, extra-axial collection or mass lesion. Nonspecific foci of T2 FLAIR hyperintense signal abnormality in subcortical and periventricular white matter are compatible with moderate chronic microvascular ischemic changes. Mild brain parenchymal volume loss. No abnormal enhancement of the brain. Extra-axial mass centered on the underside of the left tentorium cerebelli near the petroclival ligament just superior to inter auditory canal and exerting mass effect on left trigeminal nerve complex in the pre pontine cistern (series 11, image 14). The mass has a broad dural base and dural tail measuring 16 x 9 mm axially (series 12, image 12). Vascular: Normal flow voids.  Skull and upper cervical spine: Normal marrow signal. Sinuses/Orbits: Ethmoid and right frontal sinus mucosal thickening. No abnormal signal of mastoid air cells. Bilateral intra-ocular lens replacement. None Other: None. IMPRESSION: 1. Extra-axial mass on the underside of left tentorium cerebelli near petroclival ligament with broad dural base and tail likely representing meningioma. Mild mass effect on the left trigeminal nerve in the pre pontine cistern. 2. No acute intracranial abnormality. 3. Moderate chronic microvascular ischemic changes and mild parenchymal volume loss of the brain. 4. Mild paranasal sinus disease. Electronically Signed   By: Kristine Garbe M.D.   On: 06-06-2016 14:24   Dg Chest Portable 1 View  Result Date: June 06, 2016 CLINICAL DATA:  Cardiac arrest.  Intubation. EXAM: PORTABLE CHEST 1 VIEW 4:12 p.m. COMPARISON:  06-Jun-2016 at 10:08 a.m. FINDINGS: Endotracheal tube is 2 cm above the carina. Heart size and pulmonary vascularity are normal and the lungs are clear. IMPRESSION: Endotracheal tube in good position 2 cm above the carina. Clear lungs. Electronically Signed   By: Lorriane Shire M.D.   On: 06/06/16 16:25     A/P A: Cardiac arrest ([place]) PEA arrest, intermittent and frequent. Possibly due to pneumonia based on brownish sputum via ET Tube  Plan Supportive care No cath based on cards discussion with family goals of care  [facility]) Showing evidence of anoxic encephalopathy but breathign over vent  Plan No induced hypeothermia based on baseline frail health, demential nos and goals of care  Fent and versed gtt for vent syncrhony and concomeitant comfort  Shock circulatory ([place]) Came off pressors in ER  Plan MAP goal > 65  Alzheimer's disease Unclear baseline  Plan Ascertain fropm family  Goals of care, counseling/discussion DNR estaablished in ER by PCCM app They refused cath per discussion with cardiology  Pneumonia Possible pneumonia  based on season, age, presentation and brownish sputum via et tube Aspiration v HCAP v CAP  Plan Sepsis biormarker Broad antibioptics       .  Rest per NP/medical resident whose note is outlined above and that I agree with  The patient is critically ill with multiple organ systems failure and requires high complexity decision making for assessment and support, frequent evaluation and titration of therapies, application of advanced monitoring technologies and extensive interpretation of multiple databases.   Critical Care Time devoted to patient care services described in this note is  30  Minutes. This time reflects time of care of this signee Dr Brand Males. This critical care time does not reflect procedure time, or teaching time or supervisory time of PA/NP/Med student/Med Resident etc but could involve care discussion time    Dr. Brand Males, M.D., [facility].C.P Pulmonary and Critical Care Medicine Staff Physician [place] [facility] Pager: [phone], If no answer or between  15:00h - 7:00h: call [phone]  06/17/16 5:39 PM

## 2016-06-18 NOTE — ED Notes (Signed)
Pt. Walked to restroom with steady gait with standby assist by RN. Pt. Instructed to pull alarm when finished or if she needed help.

## 2016-06-18 NOTE — Progress Notes (Signed)
Patient transported to CT and back to room A13.  While in CT scanner, patient began to arrest and CPR was performed.  Once pulse obtained again completed scan and transported back to room A13.  Will continue to monitor.

## 2016-06-18 NOTE — Progress Notes (Signed)
Received call from Dr Ellender Hose , [facility], regarding patient expired after having a syncopal event.  NSTEMI suspected as cause of death.  Regards,   Deborra Medina, MD

## 2016-06-18 NOTE — ED Triage Notes (Signed)
Pt arrives with no noted SOB or deficits pt has no complaints

## 2016-06-18 NOTE — ED Notes (Signed)
Kaitlyn,NP at bedside and states to pause Nore epi until MAP less thean 570 or systolic less than 90

## 2016-06-18 NOTE — Progress Notes (Signed)
   09-Jun-2016 1510  Clinical Encounter Type  Visited With Family  Visit Type Code  Spiritual Encounters  Spiritual Needs Emotional  Stress Factors  Patient Stress Factors Not reviewed  Family Stress Factors Family relationships  Already in ED for a trauma. Offered support to family member. Family member coping and services no longer required.

## 2016-06-18 NOTE — ED Notes (Signed)
Pt returned to room from xray.

## 2016-06-18 NOTE — Assessment & Plan Note (Signed)
PEA arrest, intermittent and frequent. Possibly due to pneumonia based on brownish sputum via ET Tube  Plan Supportive care No cath based on cards discussion with family goals of care

## 2016-06-18 NOTE — Telephone Encounter (Signed)
Just an FYI.Marland Kitchen Appointment was canceled this morning because she was taken to the hospital for a possible stroke.  Gareth Eagle

## 2016-06-18 NOTE — Assessment & Plan Note (Signed)
Came off pressors in ER  Plan MAP goal > 65

## 2016-06-18 NOTE — Code Documentation (Signed)
Ct contiues

## 2016-06-18 NOTE — ED Provider Notes (Signed)
Called to bedside for patient expiration. Patient expired at 20:50. On my assessment, patient with no spontaneous respiratory movement, no corneal or pupillary reflexes, and no heart sounds on auscultation or monitor. She has no signs of life. No reflexes. Family notified and updated. Patient was DNR/Withdrawal. Will contact ME.   Duffy Bruce, MD 07-Jun-2016 2113

## 2016-06-18 NOTE — Assessment & Plan Note (Signed)
DNR estaablished in ER by PCCM app They refused cath per discussion with cardiology

## 2016-06-18 DEATH — deceased

## 2016-07-06 ENCOUNTER — Encounter (HOSPITAL_COMMUNITY): Payer: Self-pay

## 2017-07-14 IMAGING — CT CT HEAD W/O CM
3 of 4 series · 17 of 47 positions shown, 20 images · non-contrast
Comparison: None

CLINICAL DATA: Dizziness, unsteady gait, onset of symptoms upon
awakening this morning

EXAM:
CT HEAD WITHOUT CONTRAST
TECHNIQUE: Contiguous axial images were obtained from the base of the skull
through the vertex without intravenous contrast.

[Series 201: head w/o, idose (1) · axial · non-contrast · 0.49mm/px · z∈[+118,+243]mm · 11 of 31 slices shown, 14 images]
[im 3/31  brain]
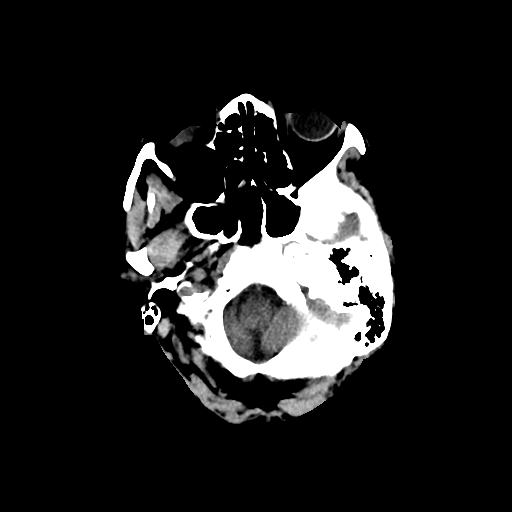
[im 3/31  bone]
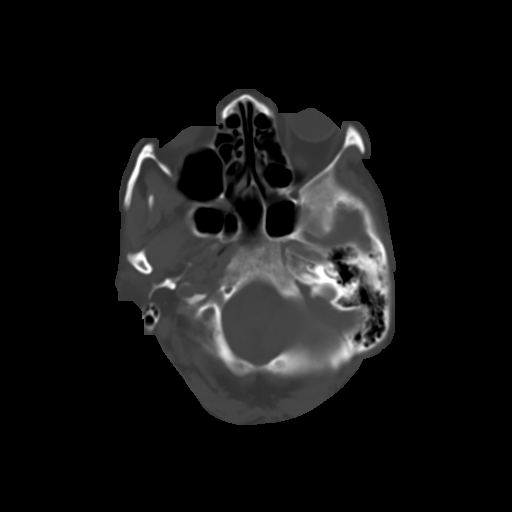
[im 5/31  brain]
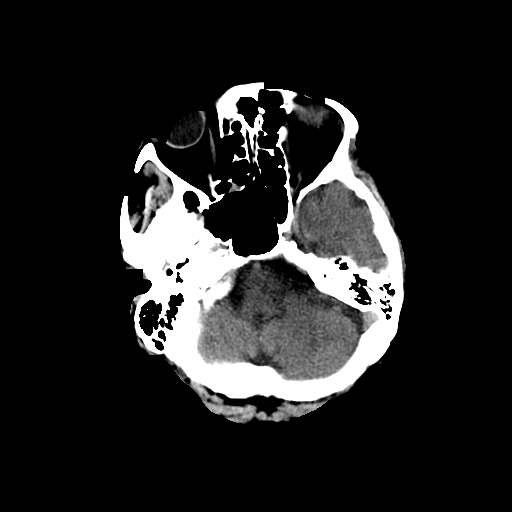
[im 7/31  brain]
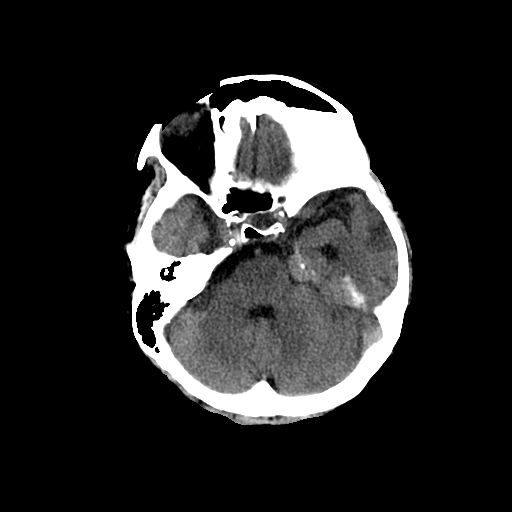
[im 11/31  brain]
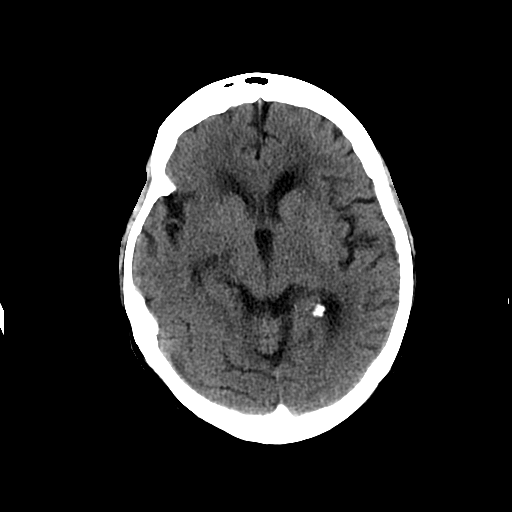
[im 13/31  brain]
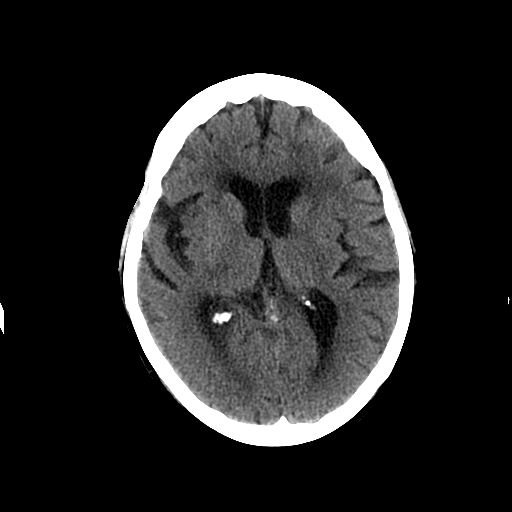
[im 13/31  bone]
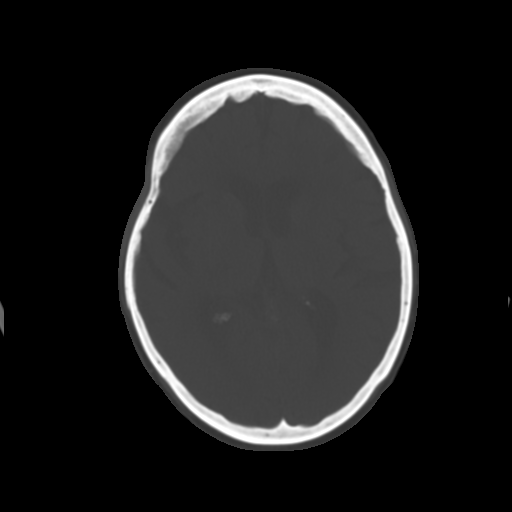
[im 16/31  brain]
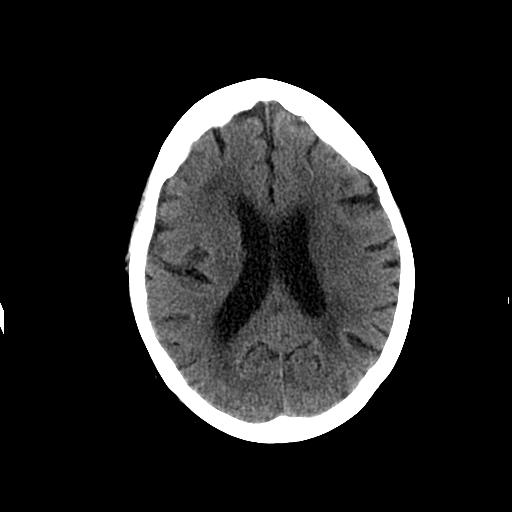
[im 18/31  brain]
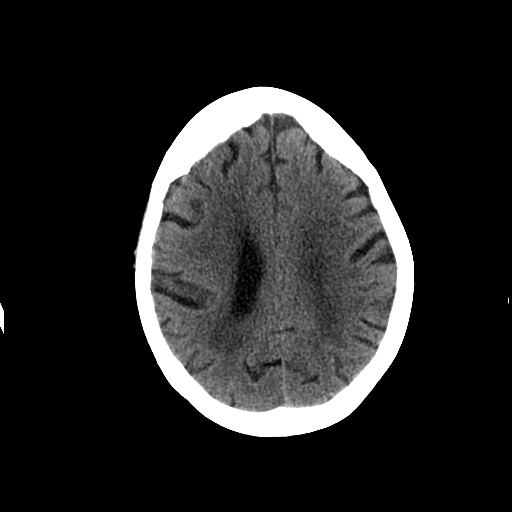
[im 20/31  brain]
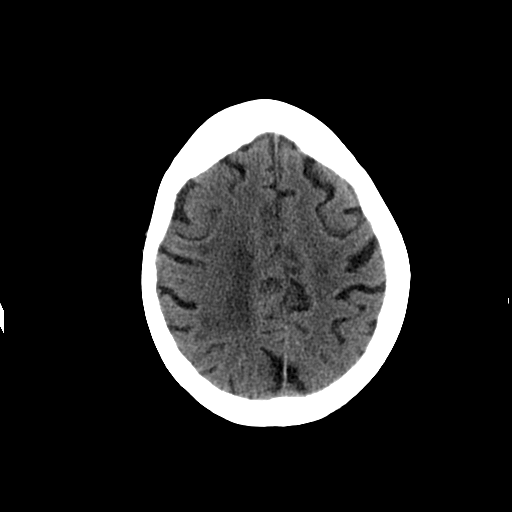
[im 24/31  brain]
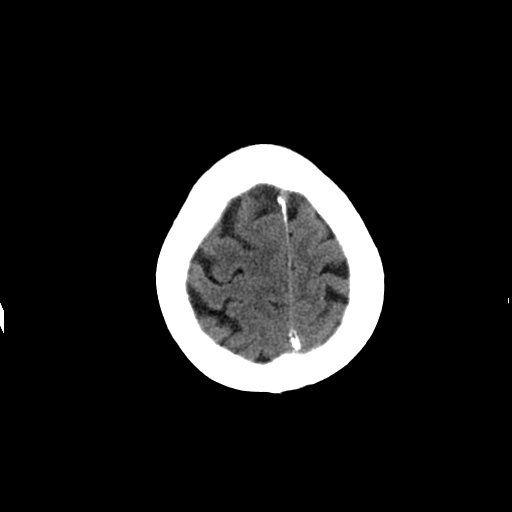
[im 24/31  bone]
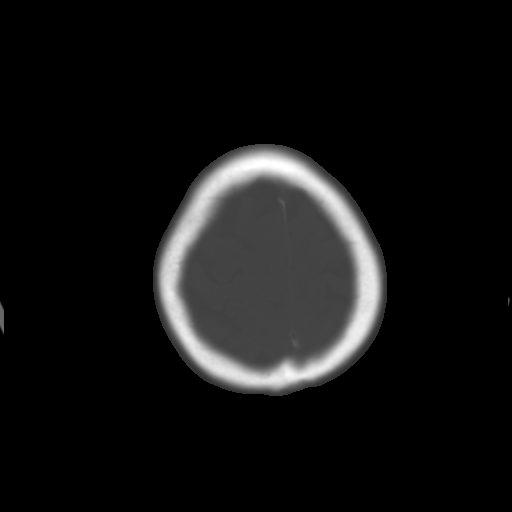
[im 26/31  brain]
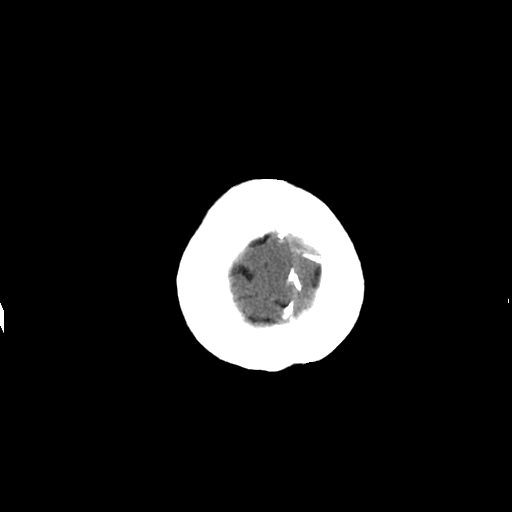
[im 28/31  brain]
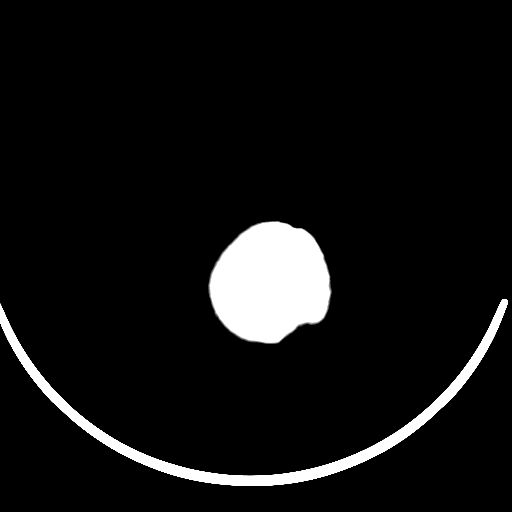

[Series 203: coronal st, idose (1) · coronal · 0.40mm/px · 3 of 73 slices shown]
[im 25/73  brain]
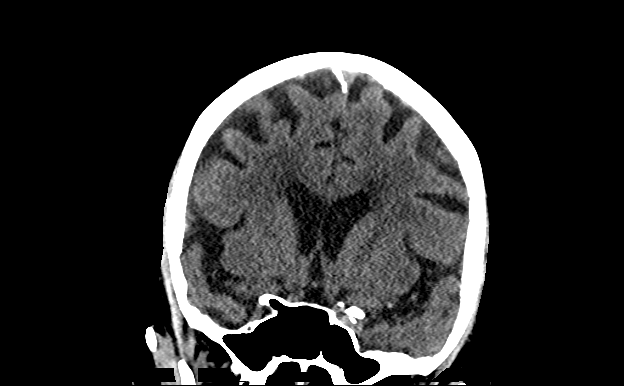
[im 33/73  brain]
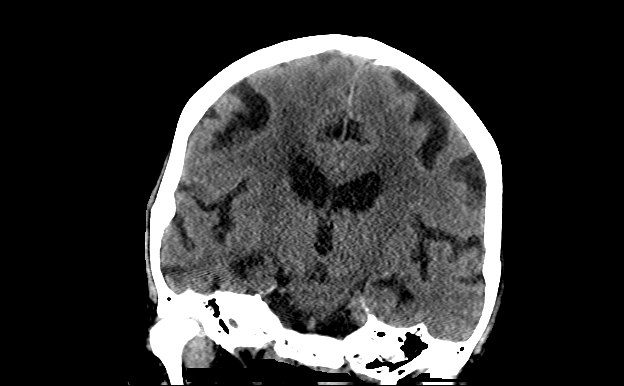
[im 41/73  brain]
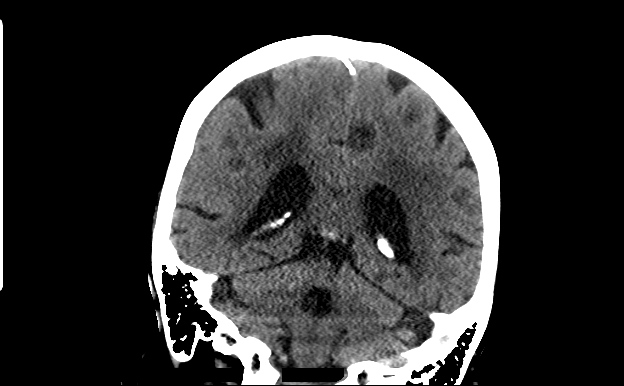

[Series 204: sagittal st, idose (1) · sagittal · 0.40mm/px · 3 of 83 slices shown]
[im 28/83  brain]
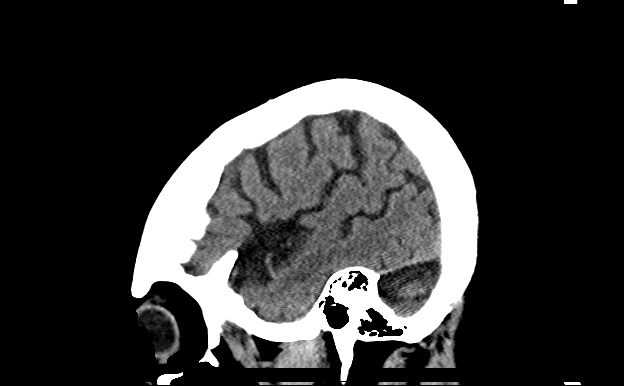
[im 42/83  brain]
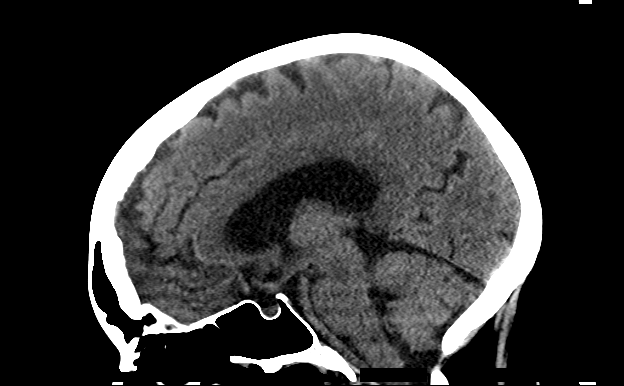
[im 55/83  brain]
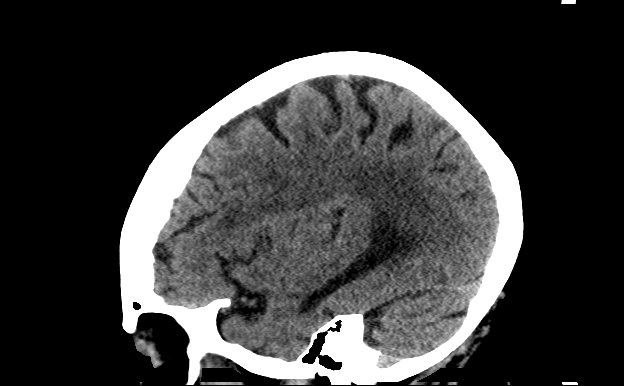

[17 of 47 positions shown; findings below may reference images not displayed]

FINDINGS: Brain: Asymmetric positioning in gantry. Generalized atrophy. Normal
ventricular morphology. No midline shift or mass effect. Small
vessel chronic ischemic changes of deep cerebral white matter. Small
soft tissue mass LEFT lateral to the pons, 14 x 11 x 11 mm in size,
above the internal auditory canal, slightly hyperdense and likely
containing a small calcification. No additional mass lesion,
intracranial hemorrhage, or evidence acute infarction. No
extra-axial fluid collections.

Vascular: Few atherosclerotic calcifications at the carotid siphons

Skull: Intact

Sinuses/Orbits: Clear

Other: N/A
IMPRESSION: Atrophy with small vessel chronic ischemic changes of deep cerebral
white matter.

Extra-axial mass at LEFT lateral aspect of pons 14 x 11 x 11 mm;
this contains a single calcification and is slightly hyperdense
versus brain parenchyma.

Favor meningioma though tumor arising from cranial nerve or unlikely
aneurysm not excluded. Further assessment by MR imaging of the brain
with/without contrast recommended to localize and characterize.

Findings called to Dr. Kasalo On 05/29/2016 at 3334 hours.

## 2017-07-14 IMAGING — CR DG CHEST 1V PORT
1 series · 1 of 1 positions shown · non-contrast
Comparison: 05/29/2016 at [DATE] a.m.

CLINICAL DATA: Cardiac arrest.  Intubation.

EXAM:
PORTABLE CHEST 1 VIEW [DATE] p.m.

[AP]
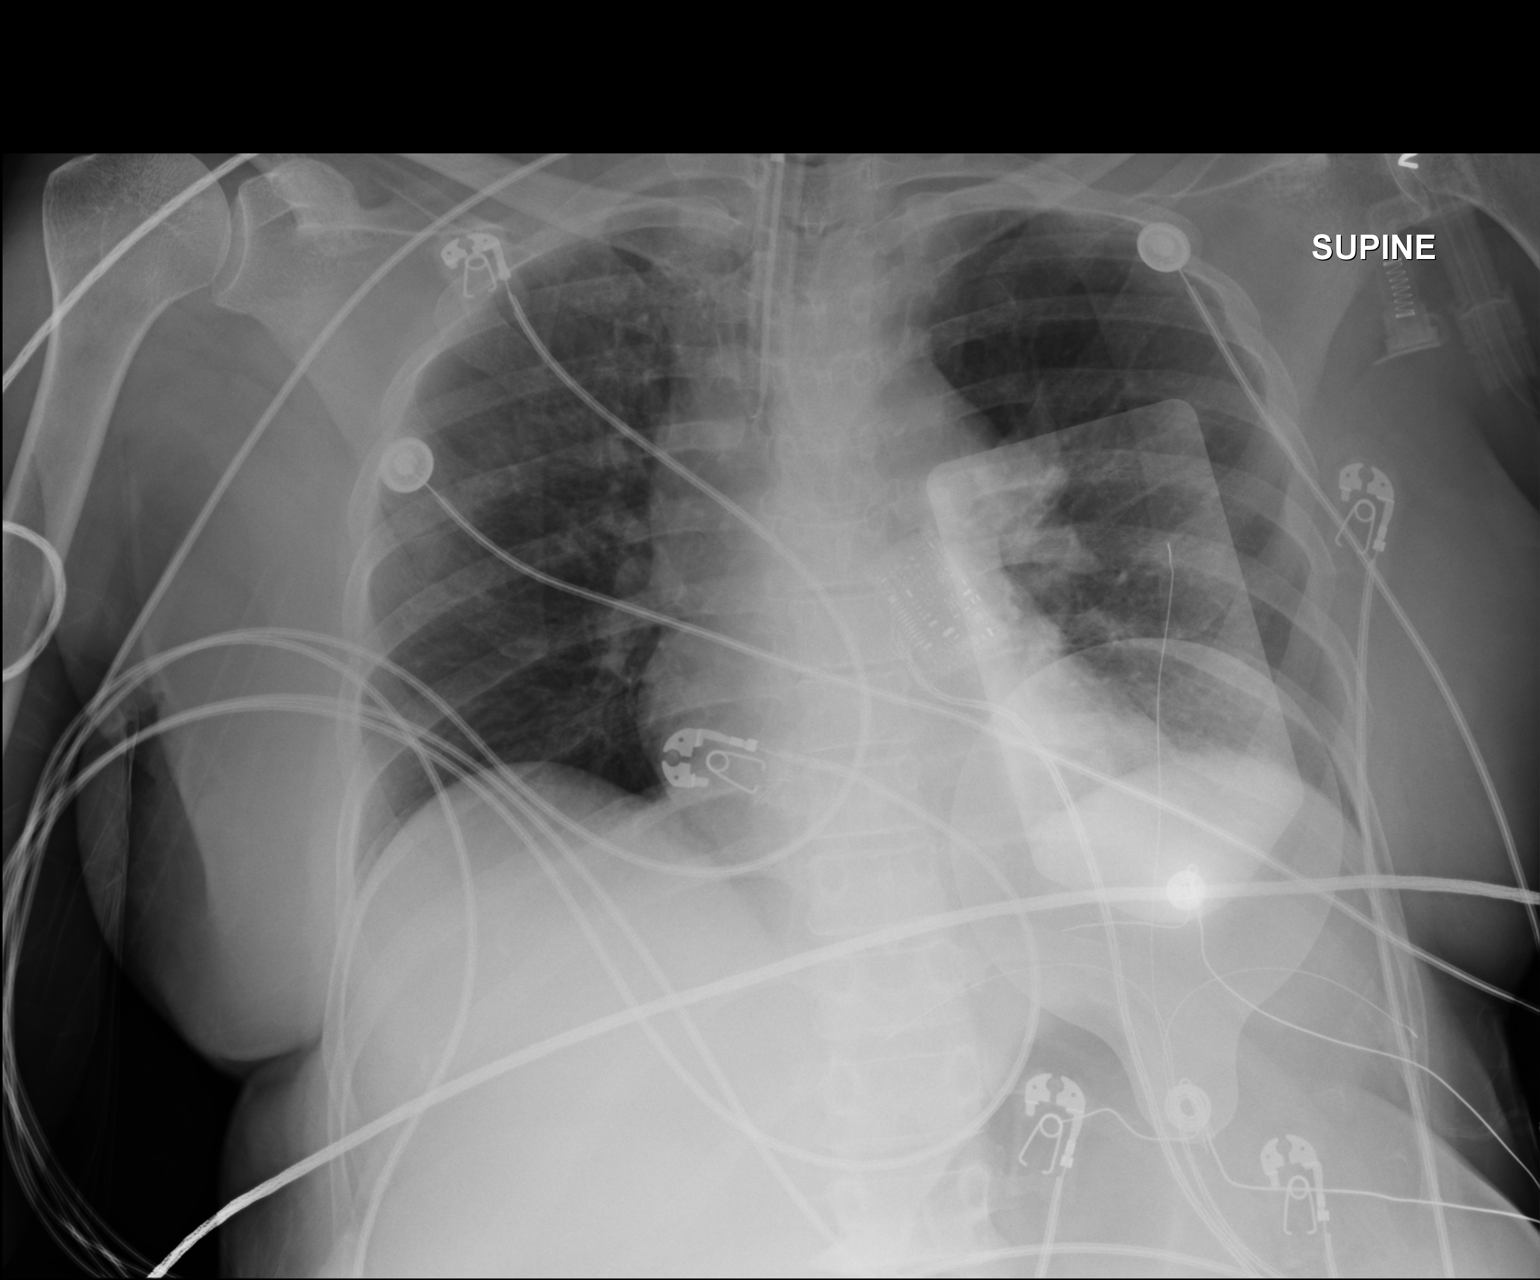

[1 of 1 positions shown; findings below may reference images not displayed]

FINDINGS: Endotracheal tube is 2 cm above the carina. Heart size and pulmonary
vascularity are normal and the lungs are clear.
IMPRESSION: Endotracheal tube in good position 2 cm above the carina. Clear
lungs.
